# Patient Record
Sex: Male | Born: 1960 | Race: Black or African American | Hispanic: No | Marital: Single | Smoking: Never smoker
Health system: Southern US, Community
[De-identification: ages and names within clinical notes are randomized; demographics above are authoritative.]

## PROBLEM LIST (undated history)

## (undated) DIAGNOSIS — I10 Essential (primary) hypertension: Secondary | ICD-10-CM

## (undated) DIAGNOSIS — Z972 Presence of dental prosthetic device (complete) (partial): Secondary | ICD-10-CM

## (undated) DIAGNOSIS — N63 Unspecified lump in unspecified breast: Secondary | ICD-10-CM

## (undated) DIAGNOSIS — E785 Hyperlipidemia, unspecified: Secondary | ICD-10-CM

## (undated) DIAGNOSIS — T7840XA Allergy, unspecified, initial encounter: Secondary | ICD-10-CM

## (undated) HISTORY — PX: WISDOM TOOTH EXTRACTION: SHX21

## (undated) HISTORY — DX: Hyperlipidemia, unspecified: E78.5

## (undated) HISTORY — DX: Allergy, unspecified, initial encounter: T78.40XA

## (undated) HISTORY — DX: Unspecified lump in unspecified breast: N63.0

## (undated) HISTORY — DX: Essential (primary) hypertension: I10

## (undated) HISTORY — DX: Presence of dental prosthetic device (complete) (partial): Z97.2

---

## 1998-09-05 ENCOUNTER — Encounter: Payer: Self-pay | Admitting: Internal Medicine

## 1998-09-05 ENCOUNTER — Ambulatory Visit (HOSPITAL_COMMUNITY): Admission: RE | Admit: 1998-09-05 | Discharge: 1998-09-05 | Payer: Self-pay | Admitting: Internal Medicine

## 2002-04-06 ENCOUNTER — Encounter: Payer: Self-pay | Admitting: General Practice

## 2002-04-06 ENCOUNTER — Encounter: Admission: RE | Admit: 2002-04-06 | Discharge: 2002-04-06 | Payer: Self-pay | Admitting: General Practice

## 2006-05-16 ENCOUNTER — Ambulatory Visit: Payer: Self-pay | Admitting: Family Medicine

## 2007-10-13 ENCOUNTER — Ambulatory Visit: Payer: Self-pay | Admitting: Family Medicine

## 2007-11-13 ENCOUNTER — Ambulatory Visit: Payer: Self-pay | Admitting: Family Medicine

## 2007-12-15 ENCOUNTER — Ambulatory Visit: Payer: Self-pay | Admitting: Family Medicine

## 2007-12-29 ENCOUNTER — Ambulatory Visit: Payer: Self-pay | Admitting: Family Medicine

## 2008-02-19 ENCOUNTER — Ambulatory Visit: Payer: Self-pay | Admitting: Family Medicine

## 2008-03-04 ENCOUNTER — Ambulatory Visit: Payer: Self-pay | Admitting: Family Medicine

## 2009-01-15 ENCOUNTER — Ambulatory Visit: Payer: Self-pay | Admitting: Family Medicine

## 2009-11-03 ENCOUNTER — Ambulatory Visit: Payer: Self-pay | Admitting: Family Medicine

## 2009-11-06 ENCOUNTER — Ambulatory Visit: Payer: Self-pay | Admitting: Family Medicine

## 2010-02-12 ENCOUNTER — Ambulatory Visit
Admission: RE | Admit: 2010-02-12 | Discharge: 2010-02-12 | Payer: Self-pay | Source: Home / Self Care | Attending: Family Medicine | Admitting: Family Medicine

## 2010-04-13 ENCOUNTER — Ambulatory Visit (INDEPENDENT_AMBULATORY_CARE_PROVIDER_SITE_OTHER): Payer: Managed Care, Other (non HMO) | Admitting: Family Medicine

## 2010-04-13 DIAGNOSIS — Z79899 Other long term (current) drug therapy: Secondary | ICD-10-CM

## 2010-04-13 DIAGNOSIS — E785 Hyperlipidemia, unspecified: Secondary | ICD-10-CM

## 2010-07-26 DIAGNOSIS — N63 Unspecified lump in unspecified breast: Secondary | ICD-10-CM

## 2010-07-26 HISTORY — DX: Unspecified lump in unspecified breast: N63.0

## 2010-08-06 ENCOUNTER — Ambulatory Visit (INDEPENDENT_AMBULATORY_CARE_PROVIDER_SITE_OTHER): Payer: Managed Care, Other (non HMO) | Admitting: Medical

## 2010-08-06 ENCOUNTER — Other Ambulatory Visit: Payer: Self-pay | Admitting: Medical

## 2010-08-06 ENCOUNTER — Encounter: Payer: Self-pay | Admitting: Medical

## 2010-08-06 VITALS — BP 130/98 | HR 60 | Temp 98.0°F | Ht 74.0 in | Wt 227.0 lb

## 2010-08-06 DIAGNOSIS — D492 Neoplasm of unspecified behavior of bone, soft tissue, and skin: Secondary | ICD-10-CM

## 2010-08-06 DIAGNOSIS — N6320 Unspecified lump in the left breast, unspecified quadrant: Secondary | ICD-10-CM

## 2010-08-06 NOTE — Progress Notes (Signed)
Subjective:   Here today for c/o growth on chest x 1-2 mo.  he denies prior similar growth. The area has not changed, but it's asymmetrical. He denies trauma or injury. Denies history of lipoma or cyst. He is a nonsmoker. Otherwise he has been in his usual state of health.  No other aggravating or relieving factors.  No other c/o.  The following portions of the patient's history were reviewed and updated as appropriate: allergies, current medications, past family history, past medical history, past social history, past surgical history and problem list.  Past Medical History  Diagnosis Date  . Hypertension   . Dyslipidemia      Review of Systems Gen.: No fever, chills, sweats, weight loss Skin: No warmth, redness, bruising Chest: Non-tender Heart: No chest pain, palpitations Lungs: No shortness of breath, cough     Objective:   Physical Exam  General appearance: alert, no distress, WD/WN, black male Skin: Unremarkable, no erythema, no dimpling Chest: Left inferomedial chest wall with roughly a 1-1/2 cm diameter broad soft tissue swelling, that is not well-defined, although it is somewhat distinguishable from surrounding tissue. It does not feel bony, but instead feels somewhat spongy in texture. Not particularly cystic in nature. Otherwise chest is nontender without any other mass. Breast: Nontender, no masses, no lymphadenopathy, no nipple changes.    Assessment :    Encounter Diagnosis  Name Primary?  . Neoplasm of soft tissue Yes     Plan:     The lesion on his left inferomedial chest doesn't necessarily feel cystic, but it is somewhat pliable and mobile. We will get breast imaging to determine the texture and size of this lesion. I advised that pending results, he may need excision or biopsy, but we will wait and see.

## 2010-08-10 ENCOUNTER — Ambulatory Visit
Admission: RE | Admit: 2010-08-10 | Discharge: 2010-08-10 | Disposition: A | Discharge status: Home / Self Care | Payer: Managed Care, Other (non HMO) | Source: Ambulatory Visit | Attending: Medical | Admitting: Medical

## 2010-08-10 ENCOUNTER — Ambulatory Visit: Admission: RE | Admit: 2010-08-10 | Payer: Managed Care, Other (non HMO) | Source: Ambulatory Visit

## 2010-08-10 ENCOUNTER — Other Ambulatory Visit: Payer: Self-pay | Admitting: Medical

## 2010-08-10 DIAGNOSIS — D492 Neoplasm of unspecified behavior of bone, soft tissue, and skin: Secondary | ICD-10-CM

## 2010-08-10 DIAGNOSIS — N6320 Unspecified lump in the left breast, unspecified quadrant: Secondary | ICD-10-CM

## 2010-08-11 ENCOUNTER — Telehealth: Payer: Self-pay | Admitting: *Deleted

## 2010-08-11 NOTE — Telephone Encounter (Addendum)
Message copied by Dorthula Perfect on Tue Aug 11, 2010 11:45 AM ------      Message from: Aleen Campi, DAVID S      Created: Mon Aug 10, 2010  4:20 PM       Ultrasound and mammogram suggest fatty lipoma or fat destruction.  No other worrisome finding currently.  They recommend repeat ultrasound of the area in 58mo.  Lets see him back at that time, but if any significant changes in the meantime let us know.   Pt notified of ultrasound and mammogram.  Pt will call back to schedule an appointment for a 3 month follow up.  CM,LPN

## 2010-09-16 ENCOUNTER — Encounter: Payer: Self-pay | Admitting: Family Medicine

## 2010-09-17 ENCOUNTER — Encounter: Payer: Self-pay | Admitting: Family Medicine

## 2011-10-04 ENCOUNTER — Ambulatory Visit (INDEPENDENT_AMBULATORY_CARE_PROVIDER_SITE_OTHER): Payer: Managed Care, Other (non HMO) | Admitting: Medical

## 2011-10-04 ENCOUNTER — Encounter: Payer: Self-pay | Admitting: Medical

## 2011-10-04 VITALS — BP 128/90 | HR 80 | Temp 98.2°F | Resp 16 | Wt 227.0 lb

## 2011-10-04 DIAGNOSIS — I10 Essential (primary) hypertension: Secondary | ICD-10-CM

## 2011-10-04 DIAGNOSIS — E785 Hyperlipidemia, unspecified: Secondary | ICD-10-CM | POA: Insufficient documentation

## 2011-10-04 DIAGNOSIS — D1779 Benign lipomatous neoplasm of other sites: Secondary | ICD-10-CM

## 2011-10-04 DIAGNOSIS — D171 Benign lipomatous neoplasm of skin and subcutaneous tissue of trunk: Secondary | ICD-10-CM

## 2011-10-04 MED ORDER — LISINOPRIL-HYDROCHLOROTHIAZIDE 20-25 MG PO TABS: 1.0000 | ORAL_TABLET | Freq: Every day | ORAL | Status: DC

## 2011-10-04 MED ORDER — VERAPAMIL HCL 180 MG (CO) PO TB24: 180.0000 mg | ORAL_TABLET | Freq: Every day | ORAL | Status: DC

## 2011-10-04 NOTE — Progress Notes (Signed)
Subjective:   HPI  Michael Calhoun is a 51 y.o. male who presents for med check.  Last saw him over a year ago for chest wall lesion which is currently unchanged from a year ago.   We had sent him for mammogram at that time.   He notes it still begin there but doesn't bother him.  Here for recheck on HTN and hyperlipidemia.   Compliant with meds, exercises regularly, reports eating healthy.   No other particularly c/o.   Needs medication refills.    No other c/o.  The following portions of the patient's history were reviewed and updated as appropriate: allergies, current medications, past family history, past medical history, past social history, past surgical history and problem list.  Past Medical History  Diagnosis Date  . Hypertension   . Dyslipidemia   . Allergy     RHINITIS    No Known Allergies   Review of Systems ROS reviewed and was negative other than noted in HPI or above.    Objective:   Physical Exam  General appearance: alert, no distress, WD/WN, AA male Neck: supple, no lymphadenopathy, no thyromegaly, no masses, no bruits Heart: RRR, normal S1, S2, no murmurs Lungs: CTA bilaterally, no wheezes, rhonchi, or rales Pulses: 2+ symmetric Chest wall with 2 cm round soft tissue mass, appear to be subcutaneous but not bony part of chest wall.   Nontender.  No warmth, erythema   Assessment and Plan :     Encounter Diagnoses  Name Primary?  . Essential hypertension, benign Yes  . Hyperlipidemia   . Submuscular lipoma of chest    HTN - increased to Lisinopril HCT 20/25 mg once daily.  C/t Verapamil 180mg  daily.  C/t daily exercise, low salt diet.  Labs today.    Hyperlipidemia - c/t Lipitor 10mg  daily, labs today.  Lipoma of chest per mammogram last year, unchanged.   He will use watch and wait approach.  Does not want general surgery referral at this time.  RTC soon for physical.   We will call with lab results.

## 2011-10-05 ENCOUNTER — Other Ambulatory Visit: Payer: Self-pay | Admitting: Medical

## 2011-10-05 LAB — CBC WITH DIFFERENTIAL/PLATELET
Basophils Absolute: 0 10*3/uL (ref 0.0–0.1)
Basophils Relative: 0 % (ref 0–1)
Eosinophils Absolute: 0.1 10*3/uL (ref 0.0–0.7)
MCH: 30.3 pg (ref 26.0–34.0)
MCHC: 33.8 g/dL (ref 30.0–36.0)
Neutrophils Relative %: 61 % (ref 43–77)
Platelets: 279 10*3/uL (ref 150–400)

## 2011-10-05 LAB — COMPREHENSIVE METABOLIC PANEL
AST: 18 U/L (ref 0–37)
Albumin: 4.3 g/dL (ref 3.5–5.2)
Alkaline Phosphatase: 65 U/L (ref 39–117)
Glucose, Bld: 70 mg/dL (ref 70–99)
Potassium: 3.8 mEq/L (ref 3.5–5.3)
Sodium: 140 mEq/L (ref 135–145)
Total Protein: 7.1 g/dL (ref 6.0–8.3)

## 2011-10-05 LAB — LIPID PANEL: Cholesterol: 201 mg/dL — ABNORMAL HIGH (ref 0–200)

## 2011-10-05 MED ORDER — ATORVASTATIN CALCIUM 20 MG PO TABS: 20.0000 mg | ORAL_TABLET | Freq: Every day | ORAL | Status: AC

## 2011-10-27 ENCOUNTER — Encounter: Payer: Self-pay | Admitting: Internal Medicine

## 2011-11-03 ENCOUNTER — Encounter: Payer: Self-pay | Admitting: Medical

## 2011-11-03 ENCOUNTER — Ambulatory Visit (INDEPENDENT_AMBULATORY_CARE_PROVIDER_SITE_OTHER): Payer: Managed Care, Other (non HMO) | Admitting: Medical

## 2011-11-03 VITALS — BP 112/80 | HR 84 | Temp 98.5°F | Resp 16 | Ht 72.0 in | Wt 226.0 lb

## 2011-11-03 DIAGNOSIS — E785 Hyperlipidemia, unspecified: Secondary | ICD-10-CM

## 2011-11-03 DIAGNOSIS — Z125 Encounter for screening for malignant neoplasm of prostate: Secondary | ICD-10-CM

## 2011-11-03 DIAGNOSIS — Z0000 Encounter for general adult medical examination without abnormal findings: Secondary | ICD-10-CM

## 2011-11-03 DIAGNOSIS — H6120 Impacted cerumen, unspecified ear: Secondary | ICD-10-CM

## 2011-11-03 DIAGNOSIS — I10 Essential (primary) hypertension: Secondary | ICD-10-CM

## 2011-11-03 DIAGNOSIS — H6122 Impacted cerumen, left ear: Secondary | ICD-10-CM

## 2011-11-03 NOTE — Progress Notes (Signed)
Subjective:   HPI  Michael Calhoun is a 51 y.o. male who presents for a complete physical.  I saw him recently for recheck on lipids and hypertension and increased doses on both medications.  He feels fine, in normal state of health.    Preventative care: Last ophthalmology visit: >1 year ago Last dental visit: no regular f/u Last tetanus booster: 2007 Flu vaccine: received through work recently  Reviewed their medical, surgical, family, social, medication, and allergy history and updated chart as appropriate.  Past Medical History  Diagnosis Date  . Hypertension   . Dyslipidemia   . Allergy     RHINITIS  . Wears partial dentures     upper  . Breast mass in male 07/2010    necrosis or possibly an ill-defined lipoma    Past Surgical History  Procedure Date  . Wisdom tooth extraction     Family History  Problem Relation Age of Onset  . Other Father     died of "natural causes"  . Heart disease Neg Hx   . Stroke Neg Hx   . Diabetes Neg Hx   . Cancer Neg Hx     History   Social History  . Marital Status: Single    Spouse Name: N/A    Number of Children: N/A  . Years of Education: N/A   Occupational History  . Medical laboratory scientific officer    Social History Main Topics  . Smoking status: Never Smoker   . Smokeless tobacco: Never Used  . Alcohol Use: 1.2 oz/week    2 Cans of beer per week     6 drinks a month  . Drug Use: No  . Sexually Active: Not on file   Other Topics Concern  . Not on file   Social History Narrative   Walking for exercise, 2 days per week; Married, 2 boys.      Current Outpatient Prescriptions on File Prior to Visit  Medication Sig Dispense Refill  . aspirin 81 MG tablet Take 81 mg by mouth daily.      Marland Kitchen atorvastatin (LIPITOR) 20 MG tablet Take 1 tablet (20 mg total) by mouth daily.  90 tablet  2  . lisinopril-hydrochlorothiazide (PRINZIDE,ZESTORETIC) 20-25 MG per tablet Take 1 tablet by mouth daily.  90 tablet  3  . Multiple Vitamin  (MULTIVITAMIN) tablet Take 1 tablet by mouth daily.        . verapamil (COVERA HS) 180 MG (CO) 24 hr tablet Take 1 tablet (180 mg total) by mouth at bedtime.  90 tablet  3    No Known Allergies   Review of Systems Constitutional: denies fever, chills, sweats, unexpected weight change, anorexia, fatigue Allergy: negative; denies recent sneezing, itching, congestion Dermatology: denies changing moles, rash, lumps, new worrisome lesions ENT: no runny nose, ear pain, sore throat, hoarseness, sinus pain, teeth pain, tinnitus, hearing loss, epistaxis Cardiology: denies chest pain, palpitations, edema, orthopnea, paroxysmal nocturnal dyspnea Respiratory: denies cough, shortness of breath, dyspnea on exertion, wheezing, hemoptysis Gastroenterology: denies abdominal pain, nausea, vomiting, diarrhea, constipation, blood in stool, changes in bowel movement, dysphagia Hematology: denies bleeding or bruising problems Musculoskeletal: denies arthralgias, myalgias, joint swelling, back pain, neck pain, cramping, gait changes Ophthalmology: denies vision changes, eye redness, itching, discharge Urology: denies dysuria, difficulty urinating, hematuria, urinary frequency, urgency, incontinence Neurology: no headache, weakness, tingling, numbness, speech abnormality, memory loss, falls, dizziness Psychology: denies depressed mood, agitation, sleep problems     Objective:   Physical Exam  Filed Vitals:  11/03/11 0937  BP: 132/90  Pulse: 84  Temp: 98.5 F (36.9 C)  Resp: 16    General appearance: alert, no distress, WD/WN, AA male Skin: anterior bilat lower legs with few scattered brown scars from prior minor trauma, few scattered macules on face, right side of nose, and torso, no worrisome lesions HEENT: normocephalic, conjunctiva/corneas normal, sclerae anicteric, PERRLA, EOMi, left ear canal with impacted cerumen, right TM pearly, nares patent, no discharge or erythema, pharynx normal Oral  cavity: MMM, tongue normal, teeth - upper dentures, lower in good repair Neck: supple, no lymphadenopathy, no thyromegaly, no masses, normal ROM, no bruits Chest: non tender, normal shape and expansion Heart: RRR, normal S1, S2, no murmurs Lungs: CTA bilaterally, no wheezes, rhonchi, or rales Abdomen: +bs, soft, non tender, non distended, no masses, no hepatomegaly, no splenomegaly, no bruits Back: non tender, normal ROM, no scoliosis Musculoskeletal: upper extremities non tender, no obvious deformity, normal ROM throughout, lower extremities non tender, no obvious deformity, normal ROM throughout Extremities: no edema, no cyanosis, no clubbing Pulses: 2+ symmetric, upper and lower extremities, normal cap refill Neurological: alert, oriented x 3, CN2-12 intact, strength normal upper extremities and lower extremities, sensation normal throughout, DTRs 2+ throughout, no cerebellar signs, gait normal Psychiatric: normal affect, behavior normal, pleasant  GU: normal male external genitalia, nontender, no masses, no hernia, no lymphadenopathy Rectal: anus normal, prostate WNL, no nodules   Adult ECG Report  Indication: hypertension, exam  Rate: 68bpm  Rhythm: normal sinus rhythm  QRS Axis: 30 degrees  PR Interval:  QRS Duration: 90ms  QTc:  Conduction Disturbances: none  Other Abnormalities: T wave inversions V3, V4  Patient's cardiac risk factors are: dyslipidemia, hypertension and male gender.  EKG comparison: 11/12/2002   Narrative Interpretation: T wave inversions in V3, V4, compared to prior EKG, no other acute changes   Assessment and Plan :    Encounter Diagnoses  Name Primary?  . Routine general medical examination at a health care facility Yes  . Essential hypertension, benign   . Prostate cancer screening   . Hyperlipidemia   . Impacted cerumen of left ear    Physical exam - discussed healthy lifestyle, diet, exercise, preventative care, vaccinations, and  addressed their concerns.  Advised he see ophthalmology and dentist yearly.  HTN - c/t current medications, improved.  No prior cardiology eval.   PSA screening today  Hyperlipidemia - c/t current medication, recheck 35mo  Impacted cerumen - with his consent, used warm water lavage to successfully remove cerumen from left ear canal  Follow-up pending labs

## 2011-11-03 NOTE — Patient Instructions (Signed)
Hypertension/high blood pressure  Avoid added salt in the diet, increase your exercise - more days per week, 40-60 minutes at a time  See eye doctor once a year for screening given your age and history of hypertension  See dentist every year for cleaning.    We are checking some additional labs today.  I'll need to have you recheck on cholesterol in 3 months since we increased the Lipitor.   Preventative Care for Adults, Male       REGULAR HEALTH EXAMS:  A routine yearly physical is a good way to check in with your primary care provider about your health and preventive screening. It is also an opportunity to share updates about your health and any concerns you have, and receive a thorough all-over exam.   Most health insurance companies pay for at least some preventative services.  Check with your health plan for specific coverages.  WHAT PREVENTATIVE SERVICES DO MEN NEED?  Adult men should have their weight and blood pressure checked regularly.   Men age 13 and older should have their cholesterol levels checked regularly.  Beginning at age 41 and continuing to age 20, men should be screened for colorectal cancer.  Certain people should may need continued testing until age 53.  Other cancer screening may include exams for testicular and prostate cancer.  Updating vaccinations is part of preventative care.  Vaccinations help protect against diseases such as the flu.  Lab tests are generally done as part of preventative care to screen for anemia and blood disorders, to screen for problems with the kidneys and liver, to screen for bladder problems, to check blood sugar, and to check your cholesterol level.  Preventative services generally include counseling about diet, exercise, avoiding tobacco, drugs, excessive alcohol consumption, and sexually transmitted infections.    GENERAL RECOMMENDATIONS FOR GOOD HEALTH:  Healthy diet:  Eat a variety of foods, including fruit, vegetables,  animal or vegetable protein, such as meat, fish, chicken, and eggs, or beans, lentils, tofu, and grains, such as rice.  Drink plenty of water daily.  Decrease saturated fat in the diet, avoid lots of red meat, processed foods, sweets, fast foods, and fried foods.  Exercise:  Aerobic exercise helps maintain good heart health. At least 30-40 minutes of moderate-intensity exercise is recommended. For example, a brisk walk that increases your heart rate and breathing. This should be done on most days of the week.   Find a type of exercise or a variety of exercises that you enjoy so that it becomes a part of your daily life.  Examples are running, walking, swimming, water aerobics, and biking.  For motivation and support, explore group exercise such as aerobic class, spin class, Zumba, Yoga,or  martial arts, etc.    Set exercise goals for yourself, such as a certain weight goal, walk or run in a race such as a 5k walk/run.  Speak to your primary care provider about exercise goals.  Disease prevention:  If you smoke or chew tobacco, find out from your caregiver how to quit. It can literally save your life, no matter how long you have been a tobacco user. If you do not use tobacco, never begin.   Maintain a healthy diet and normal weight. Increased weight leads to problems with blood pressure and diabetes.   The Body Mass Index or BMI is a way of measuring how much of your body is fat. Having a BMI above 27 increases the risk of heart disease, diabetes, hypertension,  stroke and other problems related to obesity. Your caregiver can help determine your BMI and based on it develop an exercise and dietary program to help you achieve or maintain this important measurement at a healthful level.  High blood pressure causes heart and blood vessel problems.  Persistent high blood pressure should be treated with medicine if weight loss and exercise do not work.   Fat and cholesterol leaves deposits in your  arteries that can block them. This causes heart disease and vessel disease elsewhere in your body.  If your cholesterol is found to be high, or if you have heart disease or certain other medical conditions, then you may need to have your cholesterol monitored frequently and be treated with medication.   Ask if you should have a stress test if your history suggests this. A stress test is a test done on a treadmill that looks for heart disease. This test can find disease prior to there being a problem.  Avoid drinking alcohol in excess (more than two drinks per day).  Avoid use of street drugs. Do not share needles with anyone. Ask for professional help if you need assistance or instructions on stopping the use of alcohol, cigarettes, and/or drugs.  Brush your teeth twice a day with fluoride toothpaste, and floss once a day. Good oral hygiene prevents tooth decay and gum disease. The problems can be painful, unattractive, and can cause other health problems. Visit your dentist for a routine oral and dental check up and preventive care every 6-12 months.   Look at your skin regularly.  Use a mirror to look at your back. Notify your caregivers of changes in moles, especially if there are changes in shapes, colors, a size larger than a pencil eraser, an irregular border, or development of new moles.  Safety:  Use seatbelts 100% of the time, whether driving or as a passenger.  Use safety devices such as hearing protection if you work in environments with loud noise or significant background noise.  Use safety glasses when doing any work that could send debris in to the eyes.  Use a helmet if you ride a bike or motorcycle.  Use appropriate safety gear for contact sports.  Talk to your caregiver about gun safety.  Use sunscreen with a SPF (or skin protection factor) of 15 or greater.  Lighter skinned people are at a greater risk of skin cancer. Don't forget to also wear sunglasses in order to protect your eyes  from too much damaging sunlight. Damaging sunlight can accelerate cataract formation.   Practice safe sex. Use condoms. Condoms are used for birth control and to help reduce the spread of sexually transmitted infections (or STIs).  Some of the STIs are gonorrhea (the clap), chlamydia, syphilis, trichomonas, herpes, HPV (human papilloma virus) and HIV (human immunodeficiency virus) which causes AIDS. The herpes, HIV and HPV are viral illnesses that have no cure. These can result in disability, cancer and death.   Keep carbon monoxide and smoke detectors in your home functioning at all times. Change the batteries every 6 months or use a model that plugs into the wall.   Vaccinations:  Stay up to date with your tetanus shots and other required immunizations. You should have a booster for tetanus every 10 years. Be sure to get your flu shot every year, since 5%-20% of the [place] population comes down with the flu. The flu vaccine changes each year, so being vaccinated once is not enough. Get your shot in  the fall, before the flu season peaks.   Other vaccines to consider:  Pneumococcal vaccine to protect against certain types of pneumonia.  This is normally recommended for adults age 69 or older.  However, adults younger than 49 years old with certain underlying conditions such as diabetes, heart or lung disease should also receive the vaccine.  Shingles vaccine to protect against Varicella Zoster if you are older than age 28, or younger than 49 years old with certain underlying illness.  Hepatitis A vaccine to protect against a form of infection of the liver by a virus acquired from food.  Hepatitis B vaccine to protect against a form of infection of the liver by a virus acquired from blood or body fluids, particularly if you work in health care.  If you plan to travel internationally, check with your local health department for specific vaccination recommendations.  Cancer Screening:  Most routine  colon cancer screening begins at the age of 91. On a yearly basis, doctors may provide special easy to use take-home tests to check for hidden blood in the stool. Sigmoidoscopy or colonoscopy can detect the earliest forms of colon cancer and is life saving. These tests use a small camera at the end of a tube to directly examine the colon. Speak to your caregiver about this at age 47, when routine screening begins (and is repeated every 5 years unless early forms of pre-cancerous polyps or small growths are found).   At the age of 41 men usually start screening for prostate cancer every year. Screening may begin at a younger age for those with higher risk. Those at higher risk include African-Americans or having a family history of prostate cancer. There are two types of tests for prostate cancer:   Prostate-specific antigen (PSA) testing. Recent studies raise questions about prostate cancer using PSA and you should discuss this with your caregiver.   Digital rectal exam (in which your doctor's lubricated and gloved finger feels for enlargement of the prostate through the anus).   Screening for testicular cancer.  Do a monthly exam of your testicles. Gently roll each testicle between your thumb and fingers, feeling for any abnormal lumps. The best time to do this is after a hot shower or bath when the tissues are looser. Notify your caregivers of any lumps, tenderness or changes in size or shape immediately.

## 2011-11-04 LAB — HIGH SENSITIVITY CRP: CRP, High Sensitivity: 1.9 mg/L

## 2011-11-04 LAB — PSA: PSA: 0.51 ng/mL (ref <=4.00)

## 2011-11-08 ENCOUNTER — Telehealth: Payer: Self-pay | Admitting: Family Medicine

## 2011-11-08 NOTE — Telephone Encounter (Signed)
PATIENT IS AWARE OF HIS APPOINTMENT WITH DR.TILLEY ON 11/11/11 @ 145 PM. CLS [phone]

## 2011-11-29 ENCOUNTER — Encounter: Payer: Self-pay | Admitting: Medical

## 2014-05-22 ENCOUNTER — Encounter: Payer: Self-pay | Admitting: Family Medicine

## 2014-05-22 ENCOUNTER — Ambulatory Visit (INDEPENDENT_AMBULATORY_CARE_PROVIDER_SITE_OTHER): Payer: No Typology Code available for payment source | Admitting: Family Medicine

## 2014-05-22 VITALS — BP 150/114 | HR 97 | Ht 73.25 in | Wt 242.6 lb

## 2014-05-22 DIAGNOSIS — Z1211 Encounter for screening for malignant neoplasm of colon: Secondary | ICD-10-CM | POA: Diagnosis not present

## 2014-05-22 DIAGNOSIS — Z0000 Encounter for general adult medical examination without abnormal findings: Secondary | ICD-10-CM | POA: Diagnosis not present

## 2014-05-22 DIAGNOSIS — J301 Allergic rhinitis due to pollen: Secondary | ICD-10-CM | POA: Diagnosis not present

## 2014-05-22 DIAGNOSIS — I10 Essential (primary) hypertension: Secondary | ICD-10-CM

## 2014-05-22 DIAGNOSIS — E785 Hyperlipidemia, unspecified: Secondary | ICD-10-CM

## 2014-05-22 LAB — LIPID PANEL
CHOL/HDL RATIO: 4.8 ratio
Cholesterol: 222 mg/dL — ABNORMAL HIGH (ref 0–200)
HDL: 46 mg/dL (ref >=40)
LDL Cholesterol: 157 mg/dL — ABNORMAL HIGH (ref 0–99)
Triglycerides: 95 mg/dL (ref <150)
VLDL: 19 mg/dL (ref 0–40)

## 2014-05-22 LAB — COMPREHENSIVE METABOLIC PANEL
ALT: 24 U/L (ref 0–53)
AST: 24 U/L (ref 0–37)
Albumin: 4.2 g/dL (ref 3.5–5.2)
Alkaline Phosphatase: 54 U/L (ref 39–117)
BILIRUBIN TOTAL: 0.7 mg/dL (ref 0.2–1.2)
BUN: 12 mg/dL (ref 6–23)
CHLORIDE: 103 meq/L (ref 96–112)
CO2: 30 meq/L (ref 19–32)
CREATININE: 1.12 mg/dL (ref 0.50–1.35)
Calcium: 9.3 mg/dL (ref 8.4–10.5)
Glucose, Bld: 93 mg/dL (ref 70–99)
Potassium: 3.4 mEq/L — ABNORMAL LOW (ref 3.5–5.3)
SODIUM: 142 meq/L (ref 135–145)
TOTAL PROTEIN: 6.8 g/dL (ref 6.0–8.3)

## 2014-05-22 LAB — CBC WITH DIFFERENTIAL/PLATELET
BASOS ABS: 0 10*3/uL (ref 0.0–0.1)
BASOS PCT: 0 % (ref 0–1)
EOS ABS: 0.1 10*3/uL (ref 0.0–0.7)
Eosinophils Relative: 2 % (ref 0–5)
HCT: 39.3 % (ref 39.0–52.0)
Hemoglobin: 13.3 g/dL (ref 13.0–17.0)
Lymphocytes Relative: 31 % (ref 12–46)
Lymphs Abs: 2 10*3/uL (ref 0.7–4.0)
MCH: 30.3 pg (ref 26.0–34.0)
MCHC: 33.8 g/dL (ref 30.0–36.0)
MCV: 89.5 fL (ref 78.0–100.0)
MONOS PCT: 7 % (ref 3–12)
MPV: 10.4 fL (ref 8.6–12.4)
Monocytes Absolute: 0.4 10*3/uL (ref 0.1–1.0)
NEUTROS PCT: 60 % (ref 43–77)
Neutro Abs: 3.8 10*3/uL (ref 1.7–7.7)
PLATELETS: 262 10*3/uL (ref 150–400)
RBC: 4.39 MIL/uL (ref 4.22–5.81)
RDW: 13.5 % (ref 11.5–15.5)
WBC: 6.4 10*3/uL (ref 4.0–10.5)

## 2014-05-22 MED ORDER — LISINOPRIL-HYDROCHLOROTHIAZIDE 20-25 MG PO TABS: 1.0000 | ORAL_TABLET | Freq: Every day | ORAL | Status: DC

## 2014-05-22 MED ORDER — VERAPAMIL HCL ER 360 MG PO CP24: 360.0000 mg | ORAL_CAPSULE | Freq: Every day | ORAL | Status: DC

## 2014-05-22 NOTE — Progress Notes (Signed)
   Subjective:    Patient ID: Michael Calhoun, male    DOB: [birth year], 54 y.o.   MRN: [id number]  HPI He is here for complete examination. He has been out of the country for several years. 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 His symptoms are mainly sneezing, itchy watery eyes, nasal congestion. He continues on his blood pressure medications and states that he has been taking these regularly.He does not complain of chest pain, shortness of breath, GI or GU symptomsHis marriage is going reasonably well. He has 2 children. Family and social history as well as immunizations and health maintenance was read.   Review of Systems  All other systems reviewed and are negative.      Objective:   Physical Exam BP 150/114 mmHg  Pulse 97  Ht 6' 1.25" (1.861 m)  Wt 242 lb 9.6 oz (110.043 kg)  BMI 31.77 kg/m2  General Appearance:    Alert, cooperative, no distress, appears stated age  Head:    Normocephalic, without obvious abnormality, atraumatic  Eyes:    PERRL, conjunctiva/corneas clear, EOM's intact, fundi    benign  Ears:    Normal TM's and external ear canals  Nose:   Nares normal, mucosa normal, no drainage or sinus   tenderness  Throat:   Lips, mucosa, and tongue normal; teeth and gums normal  Neck:   Supple, no lymphadenopathy;  thyroid:  no   enlargement/tenderness/nodules; no carotid   bruit or JVD  Back:    Spine nontender, no curvature, ROM normal, no CVA     tenderness  Lungs:     Clear to auscultation bilaterally without wheezes, rales or     ronchi; respirations unlabored  Chest Wall:    No tenderness or deformity   Heart:    Regular rate and rhythm, S1 and S2 normal, no murmur, rub   or gallop  Breast Exam:    No chest wall tenderness, masses or gynecomastia  Abdomen:     Soft, non-tender, nondistended, normoactive bowel sounds,    no masses, no hepatosplenomegaly          Extremities:   No clubbing, cyanosis or edema  Pulses:   2+ and symmetric all extremities  Skin:   Skin color, texture, turgor normal, no rashes or lesions  Lymph nodes:   Cervical, supraclavicular, and axillary nodes normal  Neurologic:   CNII-XII intact, normal strength, sensation and gait; reflexes 2+ and symmetric throughout          Psych:   Normal mood, affect, hygiene and grooming.          Assessment & Plan:  Routine general medical examination at a health care facility - Plan: CBC with Differential/Platelet, Comprehensive metabolic panel, Lipid panel  Special screening for malignant neoplasms, colon - Plan: Ambulatory referral to Gastroenterology  Essential hypertension, benign - Plan: verapamil (VERELAN PM) 360 MG 24 hr capsule, lisinopril-hydrochlorothiazide (PRINZIDE,ZESTORETIC) 20-25 MG per tablet, CBC with Differential/Platelet, Comprehensive metabolic panel  Hyperlipidemia - Plan: Lipid panel  Allergic rhinitis due to pollen I will increase his verapamil to 3 and 60 mg and continue on his other medication. He will continue on Claritin for his allergies. Follow-up here in one month.

## 2014-06-14 ENCOUNTER — Encounter: Payer: Self-pay | Admitting: Internal Medicine

## 2014-06-25 ENCOUNTER — Ambulatory Visit (INDEPENDENT_AMBULATORY_CARE_PROVIDER_SITE_OTHER): Payer: No Typology Code available for payment source | Admitting: Family Medicine

## 2014-06-25 ENCOUNTER — Encounter: Payer: Self-pay | Admitting: Family Medicine

## 2014-06-25 VITALS — BP 126/82 | HR 78 | Ht 73.0 in | Wt 236.4 lb

## 2014-06-25 DIAGNOSIS — I10 Essential (primary) hypertension: Secondary | ICD-10-CM

## 2014-06-25 MED ORDER — VERAPAMIL HCL ER 360 MG PO CP24: 360.0000 mg | ORAL_CAPSULE | Freq: Every day | ORAL | Status: DC

## 2014-06-25 NOTE — Progress Notes (Signed)
   Subjective:    Patient ID: Michael Calhoun, male    DOB: 06-Oct-1960, 54 y.o.   MRN: [id number]  HPI He is here for recheck. He is now on a higher strength of verapamil. He is having no difficulty with that.   Review of Systems     Objective:   Physical Exam Alert and in no distress otherwise not examined. Blood pressure is recorded       Assessment & Plan:  Essential hypertension, benign - Plan: verapamil (VERELAN PM) 360 MG 24 hr capsule He will continue on his present medication regimen.

## 2014-08-07 ENCOUNTER — Ambulatory Visit (AMBULATORY_SURGERY_CENTER): Payer: Self-pay

## 2014-08-07 VITALS — Ht 74.0 in | Wt 237.4 lb

## 2014-08-07 DIAGNOSIS — Z1211 Encounter for screening for malignant neoplasm of colon: Secondary | ICD-10-CM

## 2014-08-07 MED ORDER — MOVIPREP 100 G PO SOLR: 1.0000 | Freq: Once | ORAL | Status: DC

## 2014-08-07 NOTE — Progress Notes (Signed)
No allergies to eggs or soy No diet/weight loss meds No home oxygen No past problems with anesthesia  Has email  Emmi instructions given for colonoscopy 

## 2014-08-21 ENCOUNTER — Encounter: Payer: Self-pay | Admitting: Internal Medicine

## 2014-08-21 ENCOUNTER — Ambulatory Visit (AMBULATORY_SURGERY_CENTER): Payer: No Typology Code available for payment source | Admitting: Internal Medicine

## 2014-08-21 VITALS — BP 138/96 | HR 72 | Temp 97.2°F | Resp 20 | Ht 74.0 in | Wt 237.0 lb

## 2014-08-21 DIAGNOSIS — Z1211 Encounter for screening for malignant neoplasm of colon: Secondary | ICD-10-CM

## 2014-08-21 MED ORDER — SODIUM CHLORIDE 0.9 % IV SOLN: 500.0000 mL | INTRAVENOUS | Status: DC

## 2014-08-21 NOTE — Patient Instructions (Signed)
YOU HAD AN ENDOSCOPIC PROCEDURE TODAY AT [facility]:   Refer to the procedure report that was given to you for any specific questions about what was found during the examination.  If the procedure report does not answer your questions, please call your gastroenterologist to clarify.  If you requested that your care partner not be given the details of your procedure findings, then the procedure report has been included in a sealed envelope for you to review at your convenience later.  YOU SHOULD EXPECT: Some feelings of bloating in the abdomen. Passage of more gas than usual.  Walking can help get rid of the air that was put into your GI tract during the procedure and reduce the bloating. If you had a lower endoscopy (such as a colonoscopy or flexible sigmoidoscopy) you may notice spotting of blood in your stool or on the toilet paper. If you underwent a bowel prep for your procedure, you may not have a normal bowel movement for a few days.  Please Note:  You might notice some irritation and congestion in your nose or some drainage.  This is from the oxygen used during your procedure.  There is no need for concern and it should clear up in a day or so.  SYMPTOMS TO REPORT IMMEDIATELY:   Following lower endoscopy (colonoscopy or flexible sigmoidoscopy):  Excessive amounts of blood in the stool  Significant tenderness or worsening of abdominal pains  Swelling of the abdomen that is new, acute  Fever of 100F or higher  For urgent or emergent issues, a gastroenterologist can be reached at any hour by calling [phone].   DIET: Your first meal following the procedure should be a small meal and then it is ok to progress to your normal diet. Heavy or fried foods are harder to digest and may make you feel nauseous or bloated.  Likewise, meals heavy in dairy and vegetables can increase bloating.  Drink plenty of fluids but you should avoid alcoholic beverages for 24  hours.  ACTIVITY:  You should plan to take it easy for the rest of today and you should NOT DRIVE or use heavy machinery until tomorrow (because of the sedation medicines used during the test).    FOLLOW UP: Our staff will call the number listed on your records the next business day following your procedure to check on you and address any questions or concerns that you may have regarding the information given to you following your procedure. If we do not reach you, we will leave a message.  However, if you are feeling well and you are not experiencing any problems, there is no need to return our call.  We will assume that you have returned to your regular daily activities without incident.  If any biopsies were taken you will be contacted by phone or by letter within the next 1-3 weeks.  Please call us at [phone] if you have not heard about the biopsies in 3 weeks.    SIGNATURES/CONFIDENTIALITY: You and/or your care partner have signed paperwork which will be entered into your electronic medical record.  These signatures attest to the fact that that the information above on your After Visit Summary has been reviewed and is understood.  Full responsibility of the confidentiality of this discharge information lies with you and/or your care-partner.  Diverticulosis, high fiber diet-handouts given  Repeat colonoscopy in 10 years 2026.

## 2014-08-21 NOTE — Op Note (Signed)
[place]  [address] [place], [address]   COLONOSCOPY PROCEDURE REPORT  PATIENT: Michael Calhoun, Michael Calhoun  MR#: [id number] BIRTHDATE: 1960/04/28 , 54  yrs. old GENDER: male ENDOSCOPIST: Eustace Quail, MD REFERRED PJ:[employer], M.D. PROCEDURE DATE:  08/21/2014 PROCEDURE:   Colonoscopy, screening First Screening Colonoscopy - Avg.  risk and is 50 yrs.  old or older Yes.  Prior Negative Screening - Now for repeat screening. N/A  History of Adenoma - Now for follow-up colonoscopy & has been > or = to 3 yrs.  N/A  Polyps removed today? No Recommend repeat exam, <10 yrs? No ASA CLASS:   Class II INDICATIONS:Screening for colonic neoplasia and Colorectal Neoplasm Risk Assessment for this procedure is average risk. MEDICATIONS: Monitored anesthesia care and Propofol 400 mg IV  DESCRIPTION OF PROCEDURE:   After the risks benefits and alternatives of the procedure were thoroughly explained, informed consent was obtained.  The digital rectal exam revealed no abnormalities of the rectum.   The [id number]  endoscope was introduced through the anus and advanced to the cecum, which was identified by both the appendix and ileocecal valve. No adverse events experienced.   The quality of the prep was excellent. (MoviPrep was used)  The instrument was then slowly withdrawn as the colon was fully examined. Estimated blood loss is zero unless otherwise noted in this procedure report.      COLON FINDINGS: There was moderate diverticulosis noted in portions of the right and left colon. No polyps or other mucosal abnormalities.   The examination was otherwise normal.  Retroflexed views revealed internal hemorrhoids. The time to cecum = 5.0 Withdrawal time = 12.1   The scope was withdrawn and the procedure completed. COMPLICATIONS: There were no immediate complications.  ENDOSCOPIC IMPRESSION: 1.   Moderate diverticulosis 2.   The examination was otherwise  normal  RECOMMENDATIONS: 1. Continue current colorectal screening recommendations for "routine risk" patients with a repeat colonoscopy in 10 years.  eSigned:  Eustace Quail, MD 08/21/2014 9:37 AM   cc: Jill Alexanders, MD and The Patient

## 2014-08-21 NOTE — Progress Notes (Signed)
Report to PACU, RN, vss, BBS= Clear.  

## 2014-08-22 ENCOUNTER — Telehealth: Payer: Self-pay | Admitting: *Deleted

## 2014-08-22 NOTE — Telephone Encounter (Signed)
No answer. Number identifier. Message not able to be left as mailbox is full.

## 2014-11-15 ENCOUNTER — Ambulatory Visit (INDEPENDENT_AMBULATORY_CARE_PROVIDER_SITE_OTHER): Payer: No Typology Code available for payment source | Admitting: Medical

## 2014-11-15 ENCOUNTER — Encounter: Payer: Self-pay | Admitting: Medical

## 2014-11-15 VITALS — BP 90/60 | HR 83 | Temp 98.6°F | Wt 235.0 lb

## 2014-11-15 DIAGNOSIS — H9201 Otalgia, right ear: Secondary | ICD-10-CM

## 2014-11-15 MED ORDER — TRAMADOL HCL 50 MG PO TABS: 50.0000 mg | ORAL_TABLET | Freq: Three times a day (TID) | ORAL | Status: DC | PRN

## 2014-11-15 NOTE — Progress Notes (Signed)
Subjective: Chief Complaint  Patient presents with  . ear pain    started a week ago. hasnt had any cold recently.    Here for a pain in right ear x 1 week.  The pain radiates into the right scalp and forehead.   Denies runny nose, congestion, sore throat, no eye pain, no fever.  Has some right face/ possible teeth pain.   Last dentist visit over a year ago.   Using Tylenol for pain.   No change in hearing.  No allergy problems, no sinus problems, no recent head trauma, no throbbing blood vessels, no hx/o chronic headaches, no neck pain, no paresthesias.  Pain is achy, intermittent, not sharp, not throbbing.   No other aggravating or relieving factors. No other complaint.  Past Medical History  Diagnosis Date  . Hypertension   . Dyslipidemia   . Allergy     RHINITIS  . Wears partial dentures     upper  . Breast mass in male 07/2010    necrosis or possibly an ill-defined lipoma   ROS as in subjective  Objective: BP 90/60 mmHg  Pulse 83  Temp(Src) 98.6 F (37 C) (Oral)  Wt 235 lb (106.595 kg)  SpO2 95%  General appearance: alert, no distress, WD/WN HEENT: normocephalic, head and scalp non tender, sclerae anicteric, TMs pearly, nares patent, no discharge or erythema, pharynx normal Oral cavity: MMM, no lesions, upper denture Skin: no rash Neck: supple, no lymphadenopathy, no thyromegaly, no masses, nontender, normal ROM    Assessment: Encounter Diagnosis  Name Primary?  Michael Calhoun of right ear Yes     Plan: Exam noncontributory.   He does have dentures, but says he practices good hygiene with them.  Etiology not clear.  discussed possible etiologies.   will have him use a week of antihistamine, ibuprofen, Ultram for worse pain, and if new symptoms, worse symptoms, or not improving within a week, then recheck.

## 2014-11-15 NOTE — Patient Instructions (Signed)
The cause of your ear pain is not clear.   There is nothing obvious on your exam  Recommendations:   Begin over the counter Ibuprofen 2-3 times daily for the next week.  You may use the Ultram pain medication as needed for worse or moderate pain  Begin Benadryl or Zyrtec at bedtime for the next week in the event this is related to inner ear pressure  Drink plenty of water, swallow throughout the day to equalize ear pressure.  If your symptoms change, worsen or don't improve within a week, then let me know  Cconsider yearly follow up with a dentist soon.   Earache An earache, also called otalgia, can be caused by many things. Pain from an earache can be sharp, dull, or burning. The pain may be temporary or constant. Earaches can be caused by problems with the ear, such as infection in either the middle ear or the ear canal, injury, impacted ear wax, middle ear pressure, or a foreign body in the ear. Ear pain can also result from problems in other areas. This is called referred pain. For example, pain can come from a sore throat, a tooth infection, or problems with the jaw or the joint between the jaw and the skull (temporomandibular joint, or TMJ). The cause of an earache is not always easy to identify. Watchful waiting may be appropriate for some earaches until a clear cause of the pain can be found. HOME CARE INSTRUCTIONS Watch your condition for any changes. The following actions may help to lessen any discomfort that you are feeling:  Take medicines only as directed by your health care provider. This includes ear drops.  Apply ice to your outer ear to help reduce pain.  Put ice in a plastic bag.  Place a towel between your skin and the bag.  Leave the ice on for 20 minutes, 2-3 times per day.  Do not put anything in your ear other than medicine that is prescribed by your health care provider.  Try resting in an upright position instead of lying down. This may help to reduce  pressure in the middle ear and relieve pain.  Chew gum if it helps to relieve your ear pain.  Control any allergies that you have.  Keep all follow-up visits as directed by your health care provider. This is important. SEEK MEDICAL CARE IF:  Your pain does not improve within 2 days.  You have a fever.  You have new or worsening symptoms. SEEK IMMEDIATE MEDICAL CARE IF:  You have a severe headache.  You have a stiff neck.  You have difficulty swallowing.  You have redness or swelling behind your ear.  You have drainage from your ear.  You have hearing loss.  You feel dizzy.   This information is not intended to replace advice given to you by your health care provider. Make sure you discuss any questions you have with your health care provider.   Document Released: 08/29/2003 Document Revised: 02/01/2014 Document Reviewed: 08/12/2013 Elsevier Interactive Patient Education [employer].

## 2015-07-05 ENCOUNTER — Other Ambulatory Visit: Payer: Self-pay | Admitting: Family Medicine

## 2015-07-07 NOTE — Telephone Encounter (Signed)
Pt needs an appt. Left vm for pt to call back

## 2015-07-10 NOTE — Telephone Encounter (Signed)
Left message for pt to call back. Denying med for now.

## 2015-07-11 ENCOUNTER — Telehealth: Payer: Self-pay

## 2015-07-11 NOTE — Telephone Encounter (Signed)
We have called and left message for pt he needs appointment when that is made then we can fill his med

## 2015-07-11 NOTE — Telephone Encounter (Signed)
Faxed request for lisinopril-HCTZ to [facility]. RLB

## 2016-12-03 ENCOUNTER — Emergency Department (HOSPITAL_COMMUNITY): Payer: No Typology Code available for payment source

## 2016-12-03 ENCOUNTER — Other Ambulatory Visit: Payer: Self-pay

## 2016-12-03 ENCOUNTER — Encounter (HOSPITAL_COMMUNITY): Payer: Self-pay | Admitting: Emergency Medicine

## 2016-12-03 ENCOUNTER — Emergency Department (HOSPITAL_COMMUNITY)
Admission: EM | Admit: 2016-12-03 | Discharge: 2016-12-03 | Disposition: A | Discharge status: Home / Self Care | Payer: No Typology Code available for payment source | Attending: Emergency Medicine | Admitting: Emergency Medicine

## 2016-12-03 DIAGNOSIS — Y929 Unspecified place or not applicable: Secondary | ICD-10-CM | POA: Insufficient documentation

## 2016-12-03 DIAGNOSIS — S161XXA Strain of muscle, fascia and tendon at neck level, initial encounter: Secondary | ICD-10-CM | POA: Diagnosis not present

## 2016-12-03 DIAGNOSIS — I10 Essential (primary) hypertension: Secondary | ICD-10-CM | POA: Diagnosis not present

## 2016-12-03 DIAGNOSIS — Y939 Activity, unspecified: Secondary | ICD-10-CM | POA: Insufficient documentation

## 2016-12-03 DIAGNOSIS — R51 Headache: Secondary | ICD-10-CM | POA: Diagnosis present

## 2016-12-03 DIAGNOSIS — Z79899 Other long term (current) drug therapy: Secondary | ICD-10-CM | POA: Diagnosis not present

## 2016-12-03 DIAGNOSIS — S39012A Strain of muscle, fascia and tendon of lower back, initial encounter: Secondary | ICD-10-CM | POA: Diagnosis not present

## 2016-12-03 DIAGNOSIS — Y999 Unspecified external cause status: Secondary | ICD-10-CM | POA: Insufficient documentation

## 2016-12-03 DIAGNOSIS — Y92413 State road as the place of occurrence of the external cause: Secondary | ICD-10-CM | POA: Diagnosis not present
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
## 2016-12-03 MED ORDER — METHOCARBAMOL 500 MG PO TABS: 500.0000 mg | ORAL_TABLET | Freq: Three times a day (TID) | ORAL | 0 refills | Status: DC | PRN

## 2016-12-03 MED ORDER — IBUPROFEN 800 MG PO TABS
800.0000 mg | ORAL_TABLET | Freq: Once | ORAL | Status: AC
  Administered 2016-12-03: 800 mg via ORAL
  Filled 2016-12-03: qty 1

## 2016-12-03 MED ORDER — IBUPROFEN 800 MG PO TABS: 800.0000 mg | ORAL_TABLET | Freq: Three times a day (TID) | ORAL | 0 refills | Status: DC

## 2016-12-03 MED ORDER — METHOCARBAMOL 500 MG PO TABS
1000.0000 mg | ORAL_TABLET | Freq: Once | ORAL | Status: AC
  Administered 2016-12-03: 1000 mg via ORAL
  Filled 2016-12-03: qty 2
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
## 2016-12-03 NOTE — ED Notes (Signed)
Pt. Stated that he is due to take his blood pressure medicine at this time and will take the dose once home. To notify MD.

## 2016-12-03 NOTE — Discharge Instructions (Signed)
Ice to painful areas for the first 48 hours before using heat. Expect slow improvement over one week.
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
## 2016-12-03 NOTE — ED Notes (Signed)
C-Collar removed per Jeneen Rinks, MD

## 2017-07-18 ENCOUNTER — Encounter (HOSPITAL_COMMUNITY): Payer: Self-pay | Admitting: Emergency Medicine

## 2017-07-18 ENCOUNTER — Ambulatory Visit (HOSPITAL_COMMUNITY)
Admission: EM | Admit: 2017-07-18 | Discharge: 2017-07-18 | Disposition: A | Discharge status: Home / Self Care | Payer: No Typology Code available for payment source | Attending: Family Medicine | Admitting: Family Medicine

## 2017-07-18 DIAGNOSIS — R112 Nausea with vomiting, unspecified: Secondary | ICD-10-CM

## 2017-07-18 MED ORDER — ONDANSETRON 8 MG PO TBDP: 8.0000 mg | ORAL_TABLET | Freq: Three times a day (TID) | ORAL | 0 refills | Status: DC | PRN

## 2017-07-18 NOTE — Discharge Instructions (Addendum)
Clear liquids tonight.  Return if symptoms persist or worsen

## 2017-07-18 NOTE — ED Triage Notes (Signed)
PT reports 3 episodes of vomiting since 2pm. No diarrhea. PT reports dizziness started with vomiting. Denies chest pain, neck pain, back pain, SOB.

## 2017-07-18 NOTE — ED Provider Notes (Signed)
[facility]   [id number] 07/18/17 Arrival Time: 1835   SUBJECTIVE:  Michael Calhoun is a 57 y.o. male who presents to the urgent care with complaint of 3 episodes of vomiting since 2pm. No diarrhea. PT reports dizziness started with vomiting. Denies chest pain, neck pain, back pain, SOB.   Patient began feeling sick after cup of coffee at a truck stop yesterday evening.  He has had no abdominal pain during this entire illness.  Patient is a Administrator.  He is originally from [place].   Past Medical History:  Diagnosis Date  . Allergy    RHINITIS  . Breast mass in male 07/2010   necrosis or possibly an ill-defined lipoma  . Dyslipidemia   . Hypertension   . Wears partial dentures    upper   Family History  Problem Relation Age of Onset  . Other Father        died of "natural causes"  . Heart disease Neg Hx   . Stroke Neg Hx   . Diabetes Neg Hx   . Cancer Neg Hx   . Colon cancer Neg Hx   . Colon polyps Neg Hx   . Crohn's disease Neg Hx   . Ulcerative colitis Neg Hx   . Rectal cancer Neg Hx   . Stomach cancer Neg Hx   . Pancreatic cancer Neg Hx   . Liver cancer Neg Hx    Social History   Socioeconomic History  . Marital status: Single    Spouse name: Not on file  . Number of children: Not on file  . Years of education: Not on file  . Highest education level: Not on file  Occupational History  . Occupation: IT consultant: [employer]  . Financial resource strain: Not on file  . Food insecurity:    Worry: Not on file    Inability: Not on file  . Transportation needs:    Medical: Not on file    Non-medical: Not on file  Tobacco Use  . Smoking status: Never Smoker  . Smokeless tobacco: Never Used  Substance and Sexual Activity  . Alcohol use: Yes    Alcohol/week: 1.2 oz    Types: 2 Cans of beer per week    Comment: 6 drinks a month  . Drug use: No  . Sexual activity: Yes  Lifestyle  . Physical  activity:    Days per week: Not on file    Minutes per session: Not on file  . Stress: Not on file  Relationships  . Social connections:    Talks on phone: Not on file    Gets together: Not on file    Attends religious service: Not on file    Active member of club or organization: Not on file    Attends meetings of clubs or organizations: Not on file    Relationship status: Not on file  . Intimate partner violence:    Fear of current or ex partner: Not on file    Emotionally abused: Not on file    Physically abused: Not on file    Forced sexual activity: Not on file  Other Topics Concern  . Not on file  Social History Narrative   Walking for exercise, 2 days per week; Married, 2 boys.     Current Meds  Medication Sig  . aspirin 81 MG tablet Take 81 mg by mouth daily.  Marland Kitchen ibuprofen (ADVIL,MOTRIN) 800 MG tablet  Take 1 tablet (800 mg total) 3 (three) times daily by mouth.  Marland Kitchen lisinopril-hydrochlorothiazide (PRINZIDE,ZESTORETIC) 20-25 MG per tablet Take 1 tablet by mouth daily.  . Multiple Vitamin (MULTIVITAMIN) tablet Take 1 tablet by mouth daily.    . verapamil (VERELAN PM) 360 MG 24 hr capsule Take 1 capsule (360 mg total) by mouth at bedtime.   No Known Allergies    ROS: As per HPI, remainder of ROS negative.   OBJECTIVE:   Vitals:   07/18/17 1900 07/18/17 1901  BP:  (!) 165/111  Pulse:  79  Resp:  16  Temp:  98.7 F (37.1 C)  TempSrc:  Oral  SpO2:  98%  Weight: 225 lb (102.1 kg)      General appearance: alert; no distress Eyes: PERRL; EOMI; conjunctiva normal HENT: normocephalic; atraumatic; nasal mucosa normal; oral mucosa normal Neck: supple Lungs: clear to auscultation bilaterally Heart: regular rate and rhythm Abdomen: soft, non-tender; bowel sounds normal; no masses or organomegaly; no guarding or rebound tenderness Back: no CVA tenderness Extremities: no cyanosis or edema; symmetrical with no gross deformities Skin: warm and dry Neurologic: normal  gait; grossly normal Psychological: alert and cooperative; normal mood and affect      Labs:  Results for orders placed or performed in visit on 05/22/14  CBC with Differential/Platelet  Result Value Ref Range   WBC 6.4 4.0 - 10.5 K/uL   RBC 4.39 4.22 - 5.81 MIL/uL   Hemoglobin 13.3 13.0 - 17.0 g/dL   HCT 39.3 39.0 - 52.0 %   MCV 89.5 78.0 - 100.0 fL   MCH 30.3 26.0 - 34.0 pg   MCHC 33.8 30.0 - 36.0 g/dL   RDW 13.5 11.5 - 15.5 %   Platelets 262 150 - 400 K/uL   MPV 10.4 8.6 - 12.4 fL   Neutrophils Relative % 60 43 - 77 %   Neutro Abs 3.8 1.7 - 7.7 K/uL   Lymphocytes Relative 31 12 - 46 %   Lymphs Abs 2.0 0.7 - 4.0 K/uL   Monocytes Relative 7 3 - 12 %   Monocytes Absolute 0.4 0.1 - 1.0 K/uL   Eosinophils Relative 2 0 - 5 %   Eosinophils Absolute 0.1 0.0 - 0.7 K/uL   Basophils Relative 0 0 - 1 %   Basophils Absolute 0.0 0.0 - 0.1 K/uL   Smear Review Criteria for review not met   Comprehensive metabolic panel  Result Value Ref Range   Sodium 142 135 - 145 mEq/L   Potassium 3.4 (L) 3.5 - 5.3 mEq/L   Chloride 103 96 - 112 mEq/L   CO2 30 19 - 32 mEq/L   Glucose, Bld 93 70 - 99 mg/dL   BUN 12 6 - 23 mg/dL   Creat 1.12 0.50 - 1.35 mg/dL   Total Bilirubin 0.7 0.2 - 1.2 mg/dL   Alkaline Phosphatase 54 39 - 117 U/L   AST 24 0 - 37 U/L   ALT 24 0 - 53 U/L   Total Protein 6.8 6.0 - 8.3 g/dL   Albumin 4.2 3.5 - 5.2 g/dL   Calcium 9.3 8.4 - 10.5 mg/dL  Lipid panel  Result Value Ref Range   Cholesterol 222 (H) 0 - 200 mg/dL   Triglycerides 95 <150 mg/dL   HDL 46 >=40 mg/dL   Total CHOL/HDL Ratio 4.8 Ratio   VLDL 19 0 - 40 mg/dL   LDL Cholesterol 157 (H) 0 - 99 mg/dL    Labs Reviewed - No data  to display  No results found.     ASSESSMENT & PLAN:  1. Non-intractable vomiting with nausea, unspecified vomiting type   Clear liquids tonight.  Return if symptoms persist or worsen  Meds ordered this encounter  Medications  . ondansetron (ZOFRAN-ODT) 8 MG  disintegrating tablet    Sig: Take 1 tablet (8 mg total) by mouth every 8 (eight) hours as needed for nausea.    Dispense:  12 tablet    Refill:  0    Reviewed expectations re: course of current medical issues. Questions answered. Outlined signs and symptoms indicating need for more acute intervention. Patient verbalized understanding. After Visit Summary given.       Robyn Haber, MD 07/18/17 [phone]

## 2017-12-28 ENCOUNTER — Telehealth: Payer: Self-pay | Admitting: Family Medicine

## 2017-12-28 NOTE — Telephone Encounter (Signed)
Dismissal letter in guarantor snapshot  °

## 2020-02-29 ENCOUNTER — Other Ambulatory Visit: Payer: Self-pay

## 2020-02-29 ENCOUNTER — Ambulatory Visit
Admission: EM | Admit: 2020-02-29 | Discharge: 2020-02-29 | Disposition: A | Discharge status: Home / Self Care | Payer: Self-pay | Attending: Emergency Medicine | Admitting: Emergency Medicine

## 2020-02-29 DIAGNOSIS — S161XXA Strain of muscle, fascia and tendon at neck level, initial encounter: Secondary | ICD-10-CM

## 2020-02-29 DIAGNOSIS — M25551 Pain in right hip: Secondary | ICD-10-CM

## 2020-02-29 MED ORDER — IBUPROFEN 800 MG PO TABS: 800.0000 mg | ORAL_TABLET | Freq: Three times a day (TID) | ORAL | 0 refills | Status: AC

## 2020-02-29 MED ORDER — TIZANIDINE HCL 4 MG PO TABS: 2.0000 mg | ORAL_TABLET | Freq: Four times a day (QID) | ORAL | 0 refills | Status: DC | PRN
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
## 2020-02-29 NOTE — Discharge Instructions (Signed)
Please use ibuprofen and Tylenol as needed for pain Alternate ice and heat to areas on neck/hip May supplement with tizanidine which is a muscle relaxer, may cause drowsiness, do not drive or work after taking, limit use to at home/bedtime  Please follow-up if any symptoms not improving or worsening
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
## 2020-08-25 ENCOUNTER — Other Ambulatory Visit: Payer: Self-pay

## 2020-08-25 ENCOUNTER — Emergency Department (HOSPITAL_COMMUNITY): Payer: Self-pay

## 2020-08-25 ENCOUNTER — Emergency Department (HOSPITAL_COMMUNITY)
Admission: EM | Admit: 2020-08-25 | Discharge: 2020-08-26 | Disposition: A | Discharge status: Home / Self Care | Payer: Self-pay | Attending: Emergency Medicine | Admitting: Emergency Medicine

## 2020-08-25 DIAGNOSIS — Z79899 Other long term (current) drug therapy: Secondary | ICD-10-CM | POA: Insufficient documentation

## 2020-08-25 DIAGNOSIS — R112 Nausea with vomiting, unspecified: Secondary | ICD-10-CM | POA: Insufficient documentation

## 2020-08-25 DIAGNOSIS — I10 Essential (primary) hypertension: Secondary | ICD-10-CM | POA: Insufficient documentation

## 2020-08-25 DIAGNOSIS — R42 Dizziness and giddiness: Secondary | ICD-10-CM | POA: Insufficient documentation

## 2020-08-25 DIAGNOSIS — R519 Headache, unspecified: Secondary | ICD-10-CM | POA: Insufficient documentation

## 2020-08-25 DIAGNOSIS — R531 Weakness: Secondary | ICD-10-CM | POA: Insufficient documentation

## 2020-08-25 DIAGNOSIS — Z7982 Long term (current) use of aspirin: Secondary | ICD-10-CM | POA: Insufficient documentation

## 2020-08-25 LAB — CBG MONITORING, ED: Glucose-Capillary: 167 mg/dL — ABNORMAL HIGH (ref 70–99)

## 2020-08-25 IMAGING — CT CT HEAD W/O CM
4 series · 15 of 47 positions shown, 17 images · non-contrast
Comparison: None.

CLINICAL DATA: dizziness all day after awaking with a HA this a.m.,
worsening after [DATE]. unable to open eyes without vomiting
(nystagmus witnessed), worked outside all day.

EXAM:
CT HEAD WITHOUT CONTRAST
TECHNIQUE: Contiguous axial images were obtained from the base of the skull
through the vertex without intravenous contrast.

[Series 3: head without · axial · non-contrast · 0.45mm/px · z∈[+1126,+1246]mm · 7 of 34 slices shown, 9 images]
[im 5/34  brain]
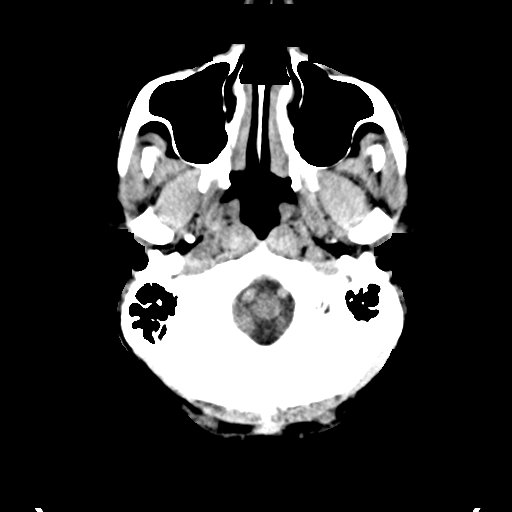
[im 5/34  bone]
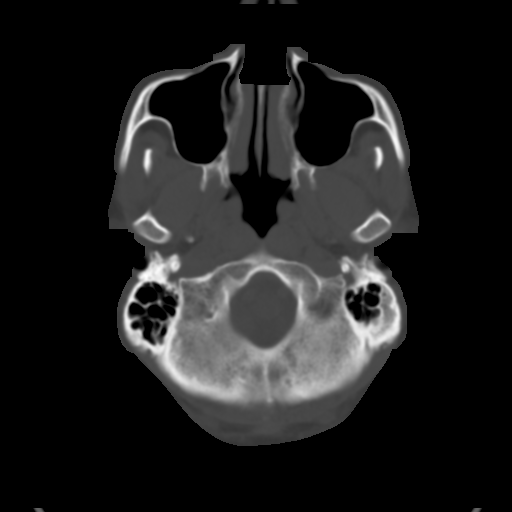
[im 9/34  brain]
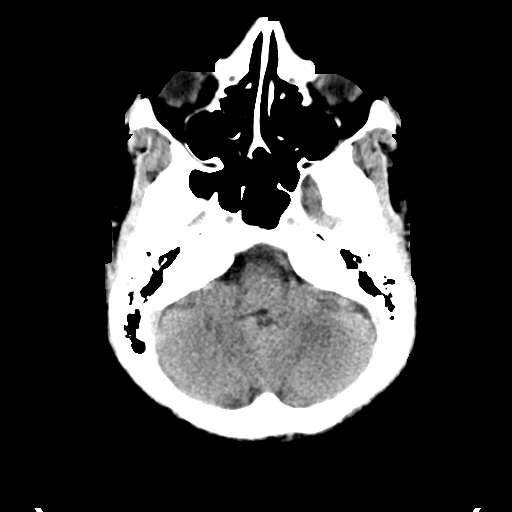
[im 13/34  brain]
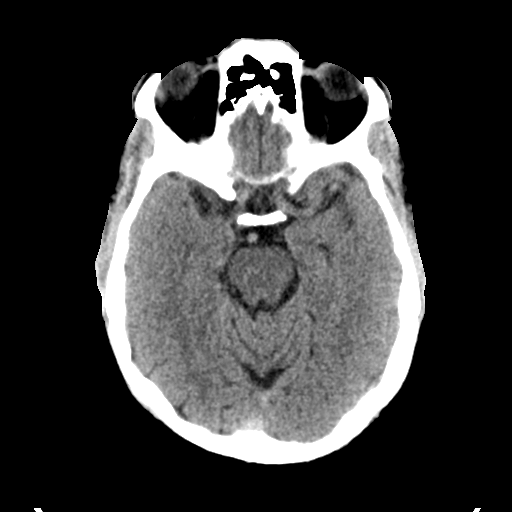
[im 17/34  brain]
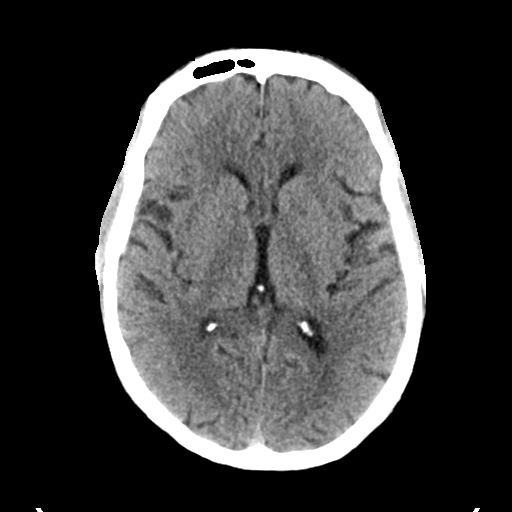
[im 21/34  brain]
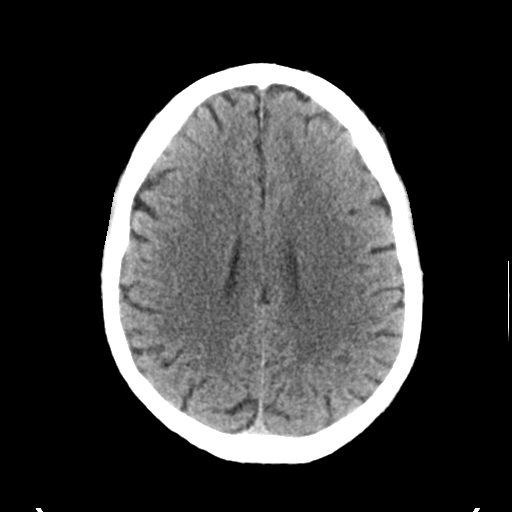
[im 21/34  bone]
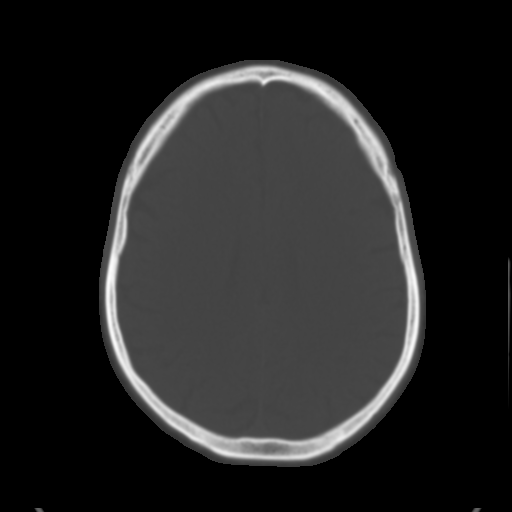
[im 25/34  brain]
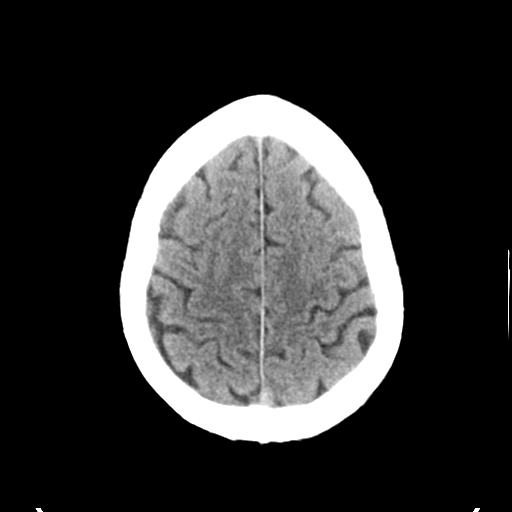
[im 29/34  brain]
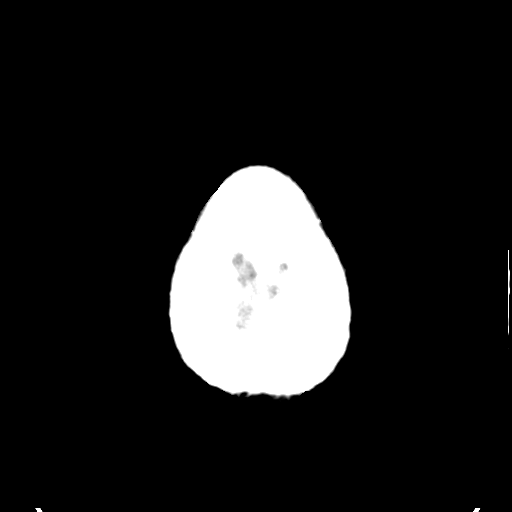

[Series 4: head bone · axial · 0.45mm/px · z∈[+1122,+1138]mm · 2 of 83 slices shown]
[im 9/83  bone]
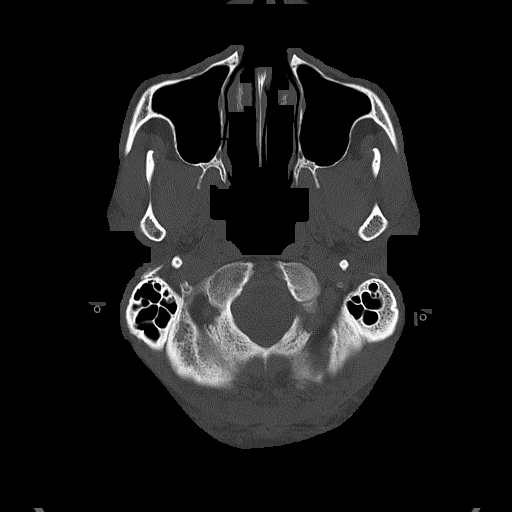
[im 17/83  bone]
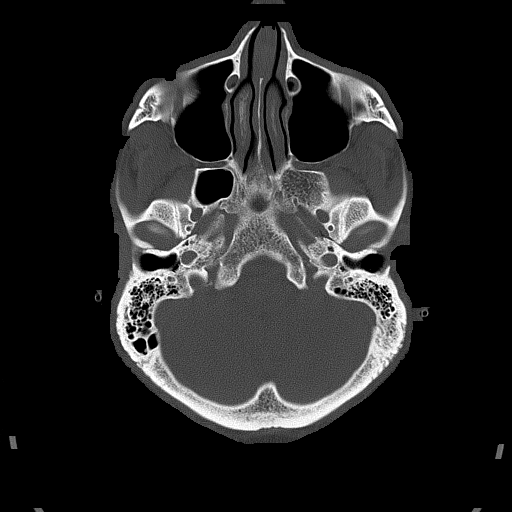

[Series 5: head without cor · coronal · non-contrast · 0.31mm/px · 3 of 75 slices shown]
[im 25/75  brain]
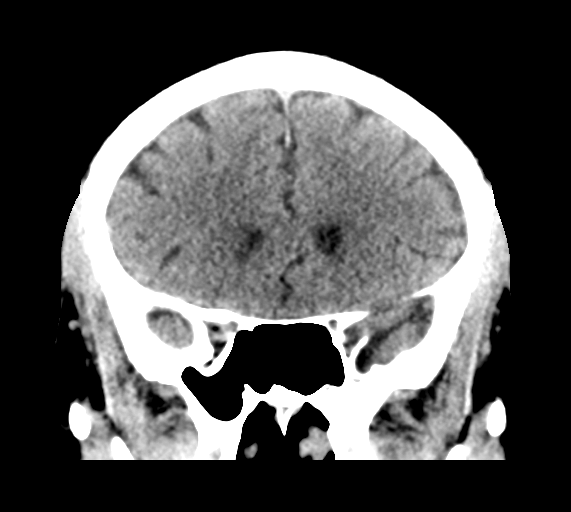
[im 33/75  brain]
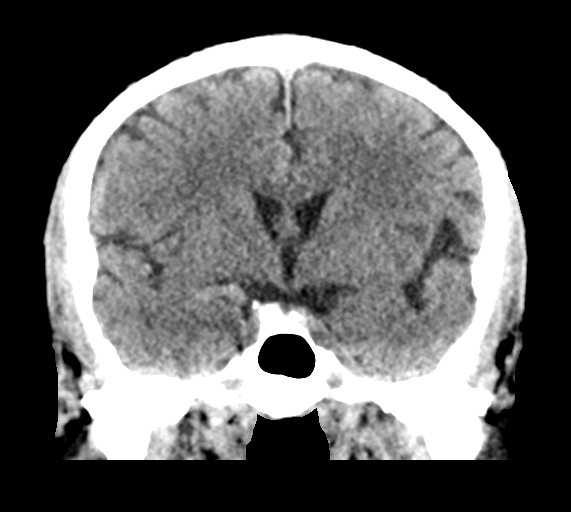
[im 42/75  brain]
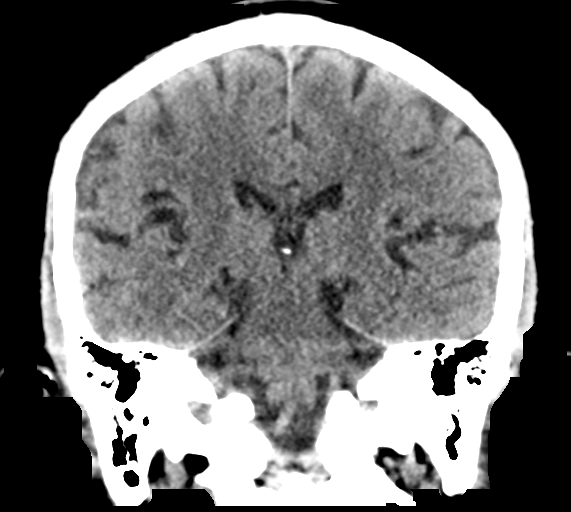

[Series 6: head without sag · sagittal · non-contrast · 0.31mm/px · 3 of 60 slices shown]
[im 20/60  brain]
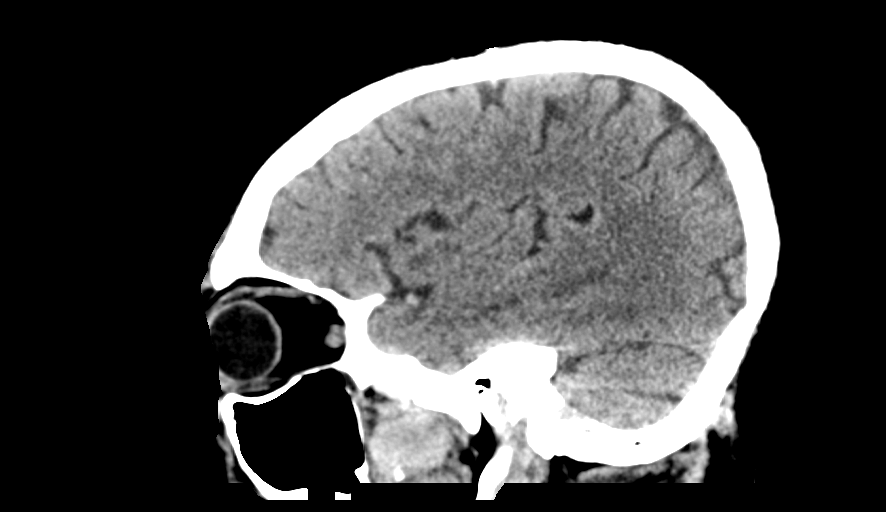
[im 30/60  brain]
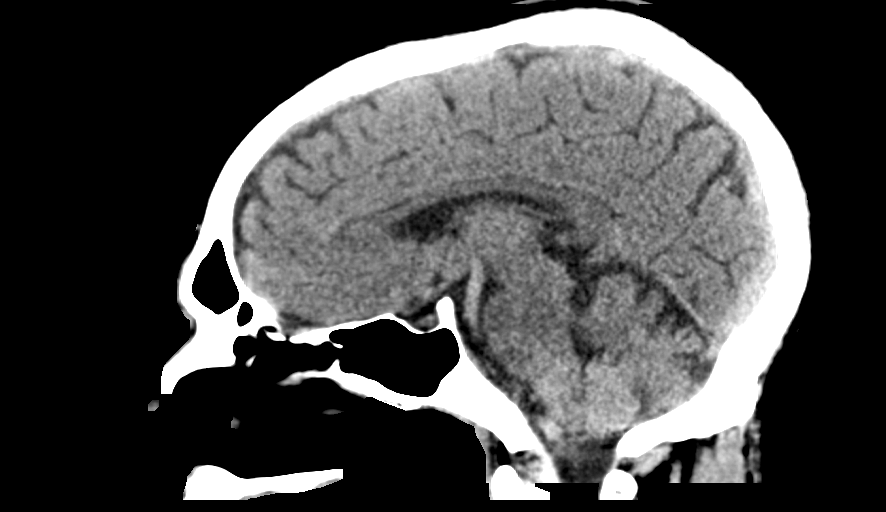
[im 40/60  brain]
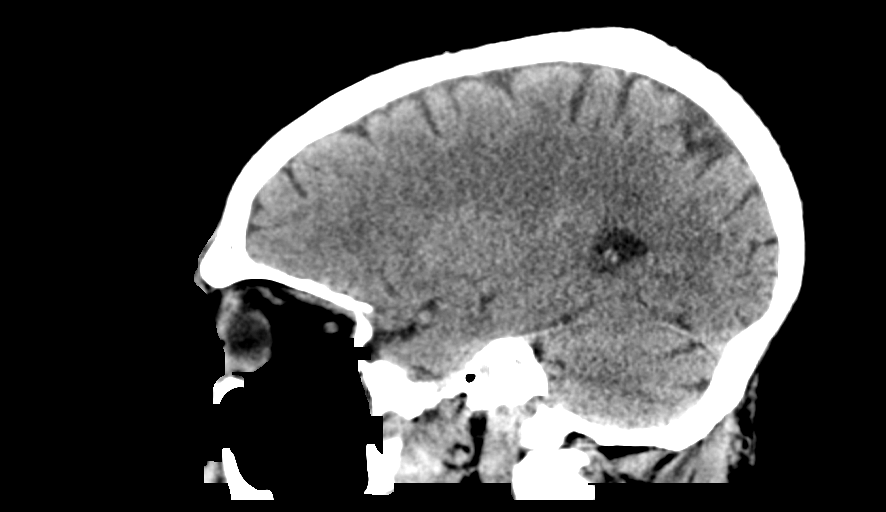

[15 of 47 positions shown; findings below may reference images not displayed]

FINDINGS: Brain:

No evidence of large-territorial acute infarction. No parenchymal
hemorrhage. No mass lesion. No extra-axial collection.

No mass effect or midline shift. No hydrocephalus. Basilar cisterns
are patent.

Vascular: No hyperdense vessel.

Skull: No acute fracture or focal lesion.

Sinuses/Orbits: Paranasal sinuses and mastoid air cells are clear.
The orbits are unremarkable.

Other: None.
IMPRESSION: No acute intracranial abnormality.

## 2020-08-25 NOTE — ED Triage Notes (Signed)
Pt c/o dizziness all day after awaking with a HA this a.m.,  worsening after 21:30. Pt is unable to open eyes without vomiting (nystagmus witnessed), worked outside all day. Was given '4mg'$  Zofran IVP.

## 2020-08-26 ENCOUNTER — Emergency Department (HOSPITAL_COMMUNITY): Payer: Self-pay

## 2020-08-26 LAB — COMPREHENSIVE METABOLIC PANEL
ALT: 22 U/L (ref 0–44)
AST: 27 U/L (ref 15–41)
Albumin: 4.1 g/dL (ref 3.5–5.0)
Alkaline Phosphatase: 57 U/L (ref 38–126)
Anion gap: 11 (ref 5–15)
BUN: 8 mg/dL (ref 6–20)
CO2: 24 mmol/L (ref 22–32)
Calcium: 8.9 mg/dL (ref 8.9–10.3)
Chloride: 106 mmol/L (ref 98–111)
Creatinine, Ser: 1.33 mg/dL — ABNORMAL HIGH (ref 0.61–1.24)
GFR, Estimated: >60 mL/min (ref >60)
Glucose, Bld: 178 mg/dL — ABNORMAL HIGH (ref 70–99)
Potassium: 2.7 mmol/L — CL (ref 3.5–5.1)
Sodium: 141 mmol/L (ref 135–145)
Total Bilirubin: 1.4 mg/dL — ABNORMAL HIGH (ref 0.3–1.2)
Total Protein: 7.3 g/dL (ref 6.5–8.1)

## 2020-08-26 LAB — CBC WITH DIFFERENTIAL/PLATELET
Abs Immature Granulocytes: 0.07 10*3/uL (ref 0.00–0.07)
Basophils Absolute: 0 10*3/uL (ref 0.0–0.1)
Basophils Relative: 0 %
Eosinophils Absolute: 0 10*3/uL (ref 0.0–0.5)
Eosinophils Relative: 0 %
HCT: 37.7 % — ABNORMAL LOW (ref 39.0–52.0)
Hemoglobin: 12.8 g/dL — ABNORMAL LOW (ref 13.0–17.0)
Immature Granulocytes: 1 %
Lymphocytes Relative: 7 %
Lymphs Abs: 0.9 10*3/uL (ref 0.7–4.0)
MCH: 30.8 pg (ref 26.0–34.0)
MCHC: 34 g/dL (ref 30.0–36.0)
MCV: 90.8 fL (ref 80.0–100.0)
Monocytes Absolute: 0.6 10*3/uL (ref 0.1–1.0)
Monocytes Relative: 5 %
Neutro Abs: 12.4 10*3/uL — ABNORMAL HIGH (ref 1.7–7.7)
Neutrophils Relative %: 87 %
Platelets: 254 10*3/uL (ref 150–400)
RBC: 4.15 MIL/uL — ABNORMAL LOW (ref 4.22–5.81)
RDW: 13 % (ref 11.5–15.5)
WBC: 14 10*3/uL — ABNORMAL HIGH (ref 4.0–10.5)
nRBC: 0 % (ref 0.0–0.2)

## 2020-08-26 LAB — URINALYSIS, ROUTINE W REFLEX MICROSCOPIC
Bilirubin Urine: NEGATIVE
Glucose, UA: NEGATIVE mg/dL
Hgb urine dipstick: NEGATIVE
Ketones, ur: 15 mg/dL — AB
Leukocytes,Ua: NEGATIVE
Nitrite: NEGATIVE
Protein, ur: NEGATIVE mg/dL
Specific Gravity, Urine: 1.015 (ref 1.005–1.030)
pH: 7 (ref 5.0–8.0)

## 2020-08-26 LAB — TROPONIN I (HIGH SENSITIVITY)
Troponin I (High Sensitivity): 4 ng/L (ref <18)
Troponin I (High Sensitivity): 6 ng/L (ref <18)

## 2020-08-26 LAB — LACTIC ACID, PLASMA: Lactic Acid, Venous: 2.1 mmol/L (ref 0.5–1.9)

## 2020-08-26 LAB — CK: Total CK: 528 U/L — ABNORMAL HIGH (ref 49–397)

## 2020-08-26 IMAGING — MR MR MRA HEAD W/O CM
1 series · 19 of 48 positions shown · IV contrast (gadavist)
Comparison: No pertinent prior exam.

CLINICAL DATA: Dizziness after waking with headache this morning.
Vertebral dissection suspected.

EXAM:
MRI HEAD WITHOUT CONTRAST
MRA HEAD WITHOUT CONTRAST
MRA OF THE NECK WITHOUT AND WITH CONTRAST
TECHNIQUE: Multiplanar, multi-echo pulse sequences of the brain and surrounding
structures were acquired without intravenous contrast. Angiographic
images of the Circle of Willis were acquired using MRA technique
without intravenous contrast. Angiographic images of the neck were
acquired using MRA technique without and with intravenous contrast.
Carotid stenosis measurements (when applicable) are obtained
utilizing NASCET criteria, using the distal internal carotid
diameter as the denominator.
CONTRAST:  10mL GADAVIST GADOBUTROL 1 MMOL/ML IV SOLN

[Series 9: 3d cow · axial · 0.5mm · 0.41mm/px · z∈[-93,-11]mm · 19 of 172 slices shown]
[im 1/172]
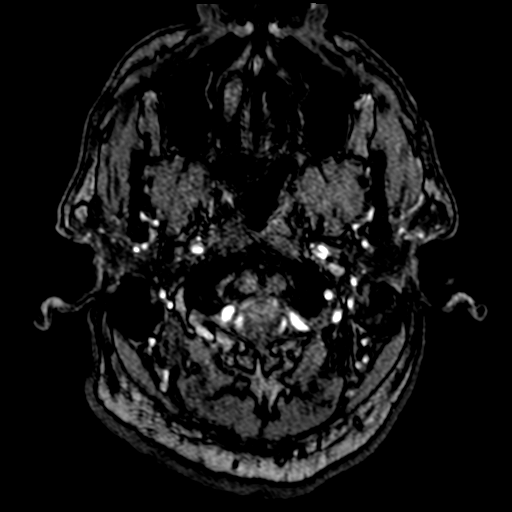
[im 4/172]
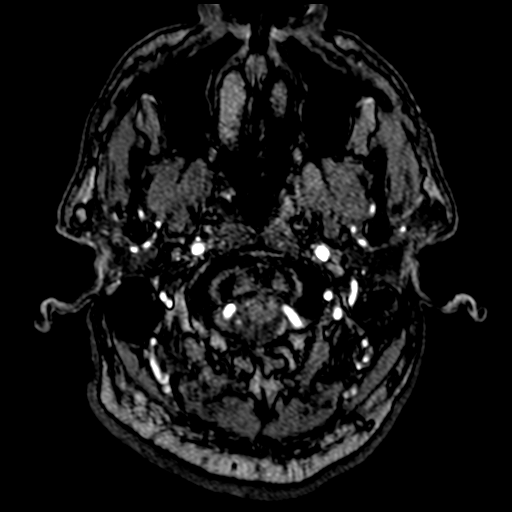
[im 8/172]
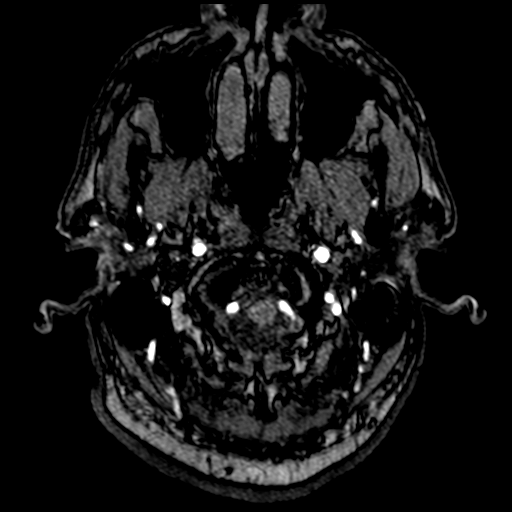
[im 11/172]
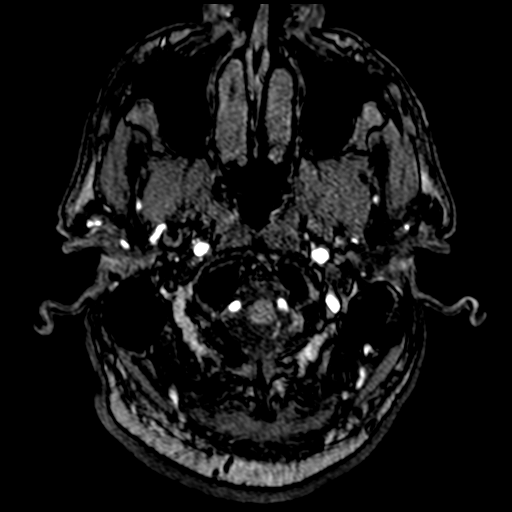
[im 15/172]
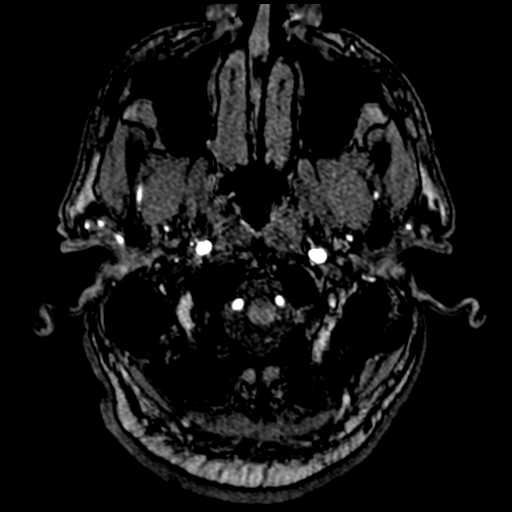
[im 19/172]
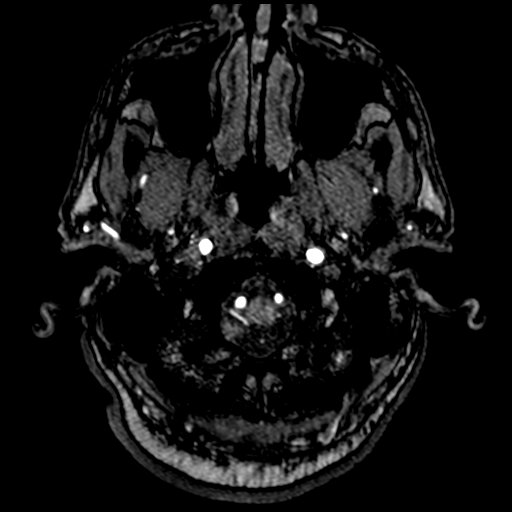
[im 22/172]
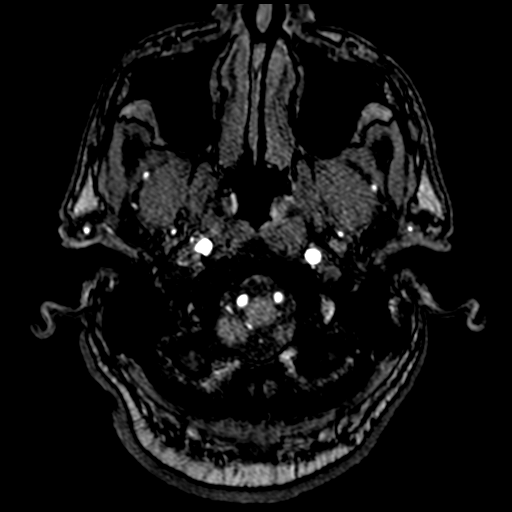
[im 26/172]
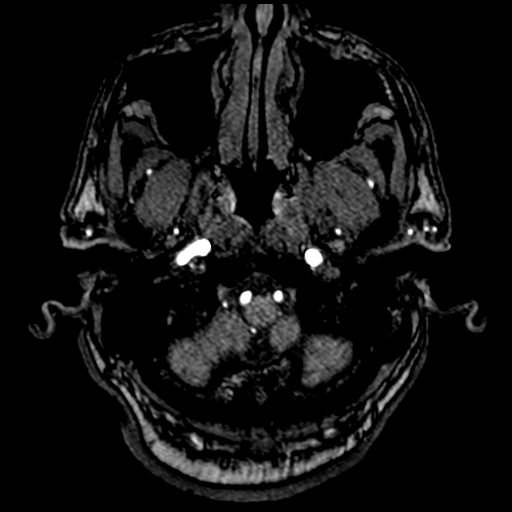
[im 30/172]
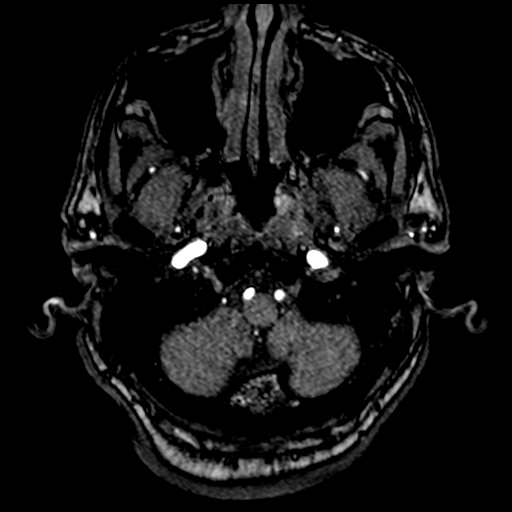
[im 33/172]
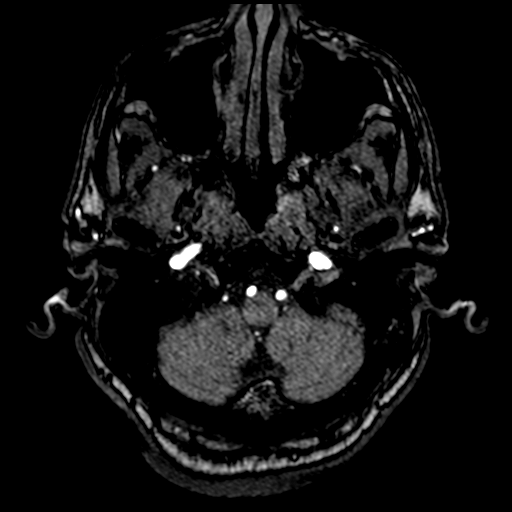
[im 37/172]
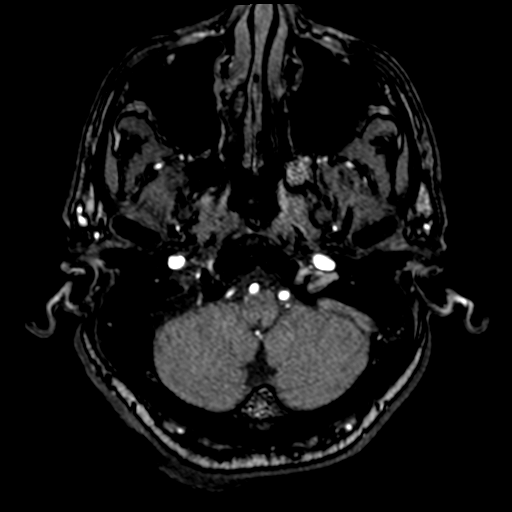
[im 55/172]
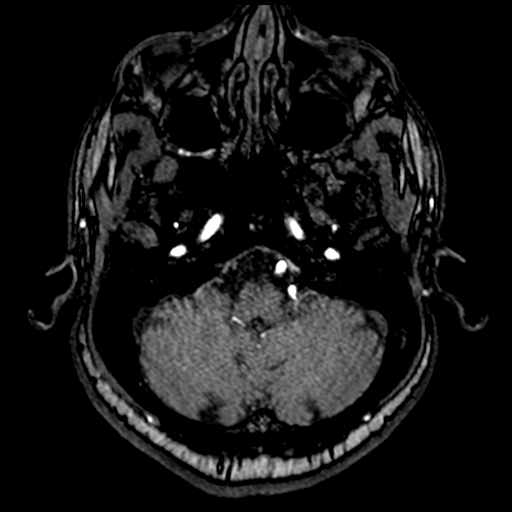
[im 77/172]
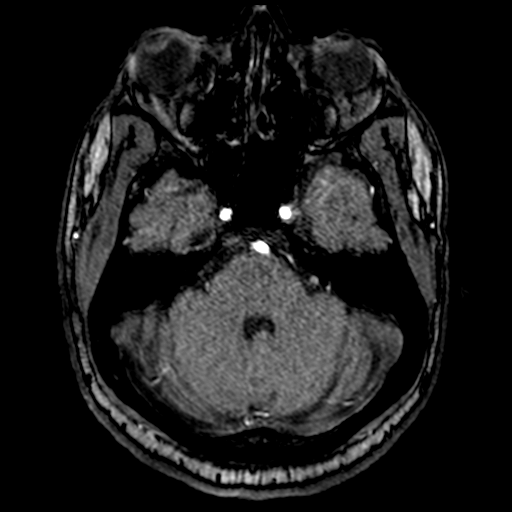
[im 88/172]
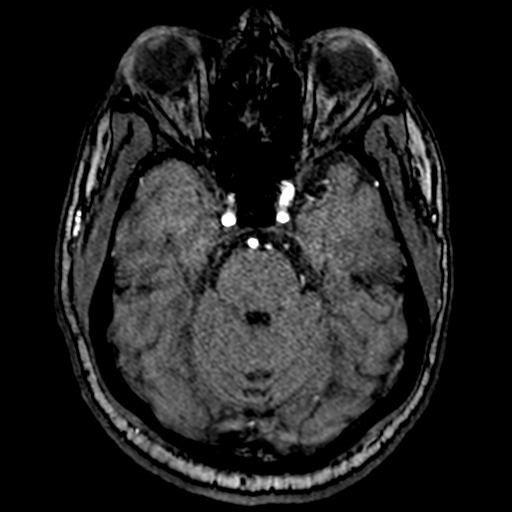
[im 99/172]
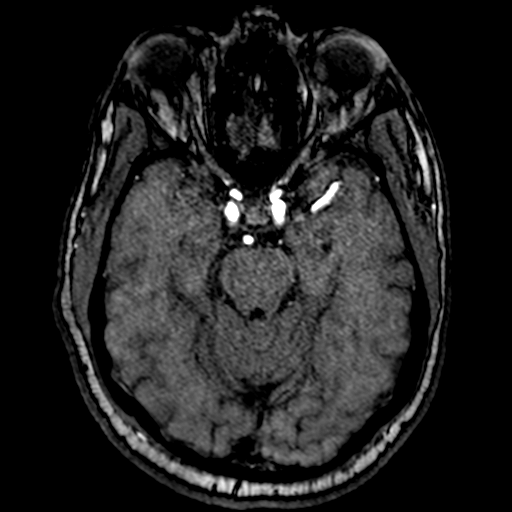
[im 121/172]
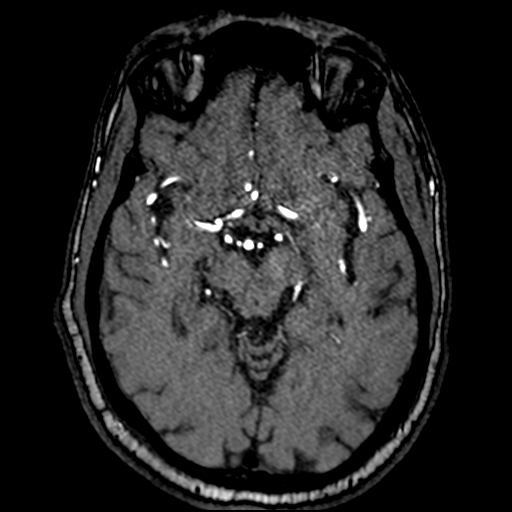
[im 142/172]
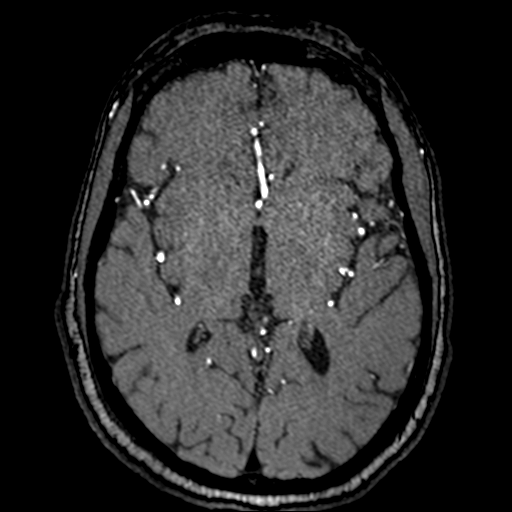
[im 146/172]
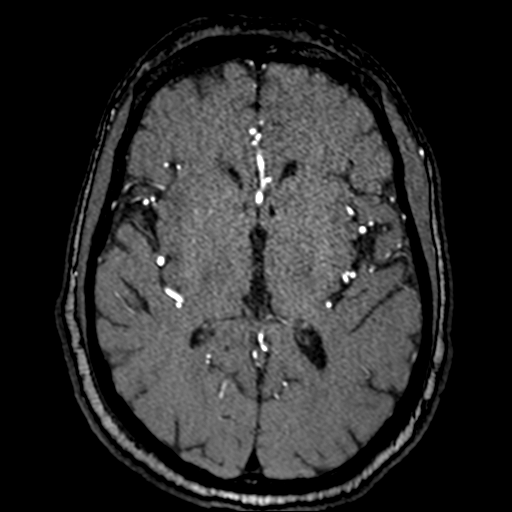
[im 164/172]
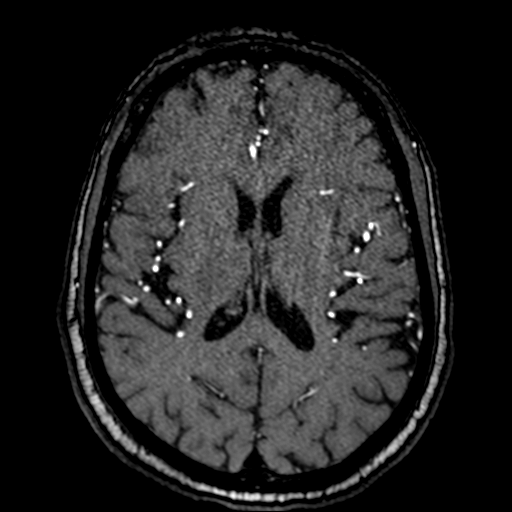

[19 of 48 positions shown; findings below may reference images not displayed]

FINDINGS: MR HEAD FINDINGS

Brain: No acute infarction, hemorrhage, hydrocephalus, extra-axial
collection or mass lesion. Mild chronic small vessel ischemic type
change in the hemispheric white matter.

Vascular: See below

Skull and upper cervical spine: Expansile lesion in the left
paramedian parietal bone with circumscribed margins by MRI, up to 2
cm. No significant change from [V6], most consistent with fibrous
dysplasia based on CT matrix.

Sinuses/Orbits: Negative.

MRA HEAD FINDINGS

Anterior circulation: Vessels are smoothly contoured and widely
patent. No evidence of vascular malformation or aneurysm.

Posterior circulation: Vertebral and basilar arteries are smooth and
widely patent. At the left V3 segment there is partial obscuration
due to intravenous contrast, but normal appearing by time-of-flight
technique. No branch occlusion, beading, or aneurysm.

Anatomic variants:None significant

MRA NECK FINDINGS

Aortic arch: Negative

Right carotid system: Vessels are smooth and diffusely patent

Left carotid system: Vessels are smooth and diffusely patent.

Vertebral arteries: No proximal subclavian or vertebral stenosis or
beading.

Other: Antegrade flow in carotid and vertebral arteries by
noncontrast technique.
IMPRESSION: Brain MRI:

No acute finding.

Intracranial and neck MRA:

Negative.

## 2020-08-26 IMAGING — MR MR HEAD W/O CM
12 of 13 series · 44 of 48 positions shown · IV contrast (gadavist)
Comparison: No pertinent prior exam.

CLINICAL DATA: Dizziness after waking with headache this morning.
Vertebral dissection suspected.

EXAM:
MRI HEAD WITHOUT CONTRAST
MRA HEAD WITHOUT CONTRAST
MRA OF THE NECK WITHOUT AND WITH CONTRAST
TECHNIQUE: Multiplanar, multi-echo pulse sequences of the brain and surrounding
structures were acquired without intravenous contrast. Angiographic
images of the Circle of Willis were acquired using MRA technique
without intravenous contrast. Angiographic images of the neck were
acquired using MRA technique without and with intravenous contrast.
Carotid stenosis measurements (when applicable) are obtained
utilizing NASCET criteria, using the distal internal carotid
diameter as the denominator.
CONTRAST:  10mL GADAVIST GADOBUTROL 1 MMOL/ML IV SOLN

[Series 5: DWI · axial · 3.0mm · 0.88mm/px · z∈[-104,+48]mm · 7 of 104 slices shown (1 of 4)]
[im 1/104]
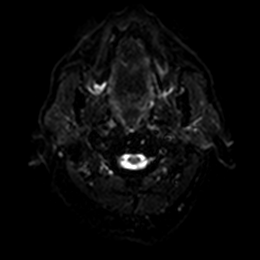
[im 18/104]
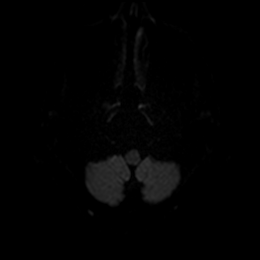
[im 35/104]
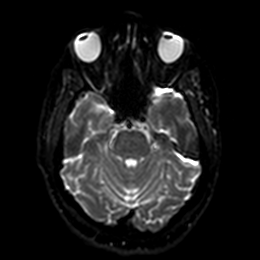
[im 52/104]
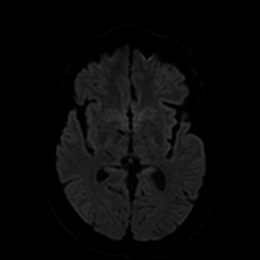
[im 69/104]
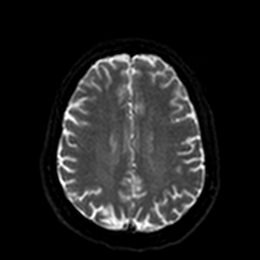
[im 86/104]
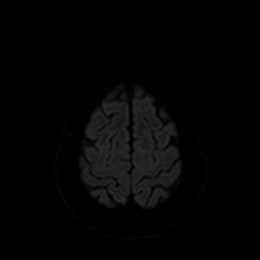
[im 104/104]
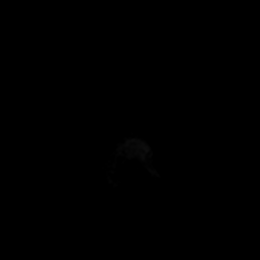

[Series 6: DWI · axial · 3.0mm · 0.88mm/px · z∈[-104,+48]mm · 4 of 52 slices shown (2 of 4)]
[im 1/52]
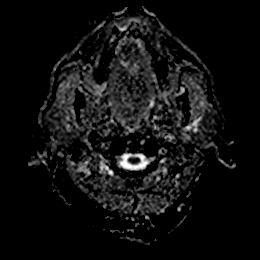
[im 18/52]
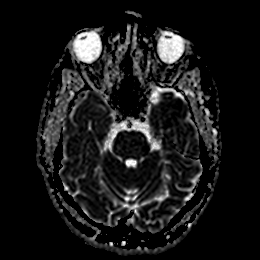
[im 35/52]
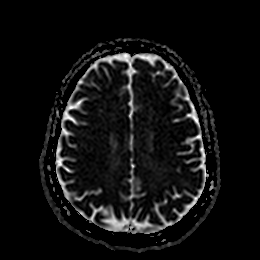
[im 52/52]
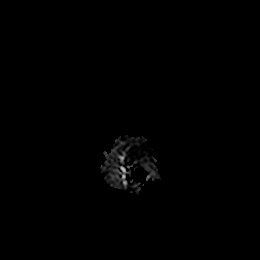

[Series 7: DWI · coronal · 4.0mm · 0.88mm/px · 6 of 78 slices shown (3 of 4)]
[im 1/78]
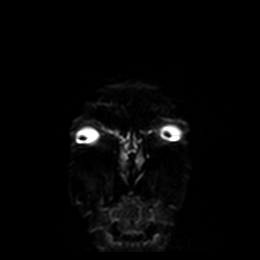
[im 16/78]
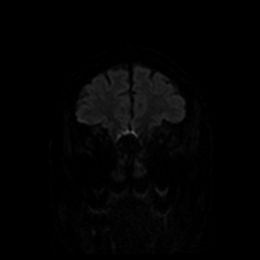
[im 31/78]
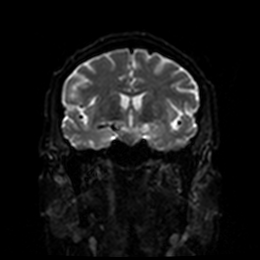
[im 47/78]
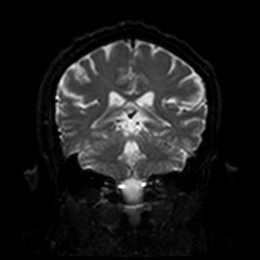
[im 62/78]
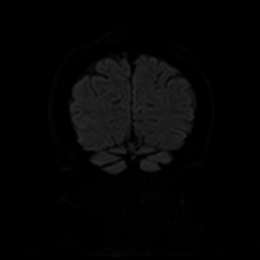
[im 78/78]
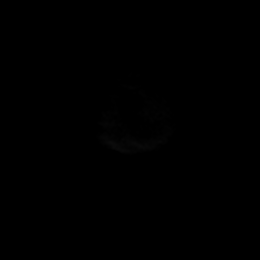

[Series 8: DWI · coronal · 4.0mm · 0.88mm/px · 3 of 39 slices shown (4 of 4)]
[im 1/39]
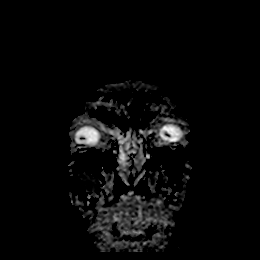
[im 20/39]
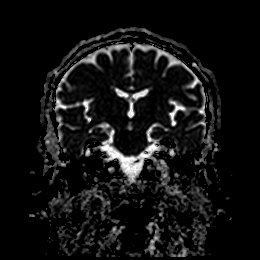
[im 39/39]
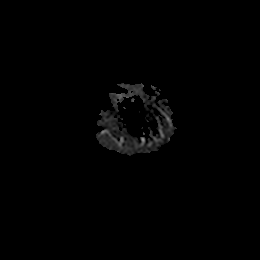

[Series 13: T1 · sagittal · 5.0mm · 0.75mm/px · 2 of 26 slices shown]
[im 1/26]
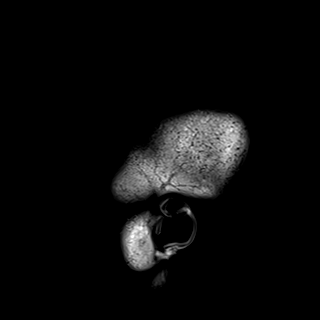
[im 26/26]
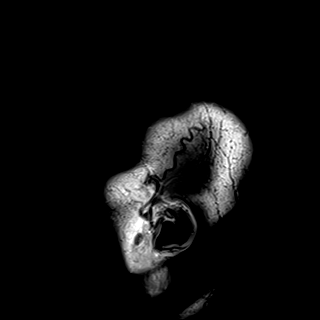

[Series 14: T2 · axial · 5.0mm · 0.72mm/px · z∈[-106,+49]mm · 2 of 27 slices shown (1 of 2)]
[im 1/27]
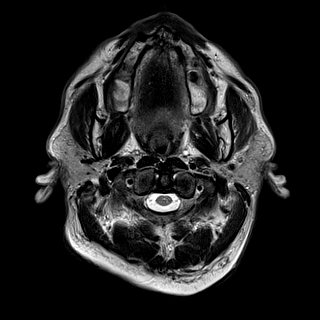
[im 27/27]
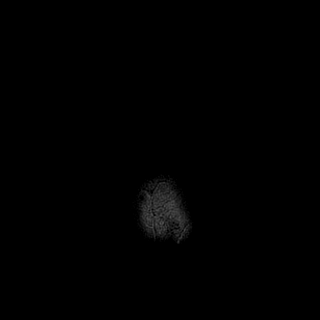

[Series 15: FLAIR · axial · 5.0mm · 0.45mm/px · z∈[-106,+49]mm · 2 of 27 slices shown]
[im 1/27]
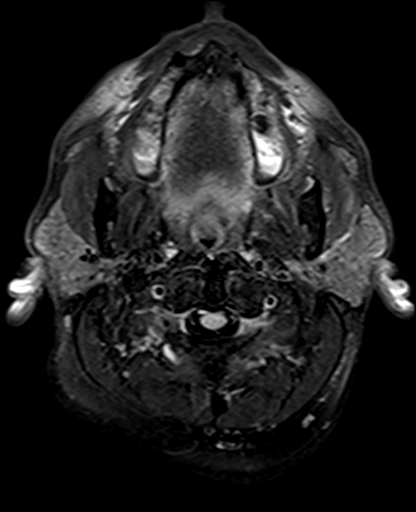
[im 27/27]
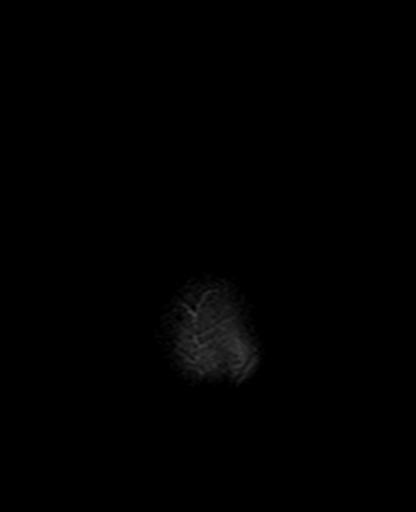

[Series 16: mag_images · axial · 3.0mm · 0.90mm/px · z∈[-110,+54]mm · 4 of 56 slices shown]
[im 1/56]
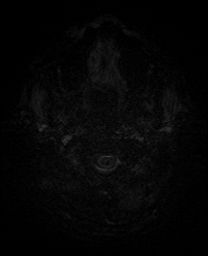
[im 19/56]
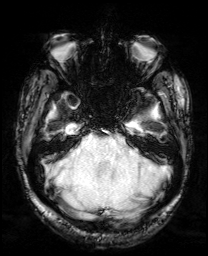
[im 37/56]
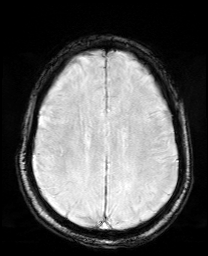
[im 56/56]
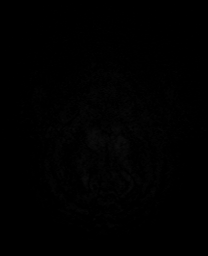

[Series 17: pha_images · axial · 3.0mm · 0.90mm/px · z∈[-110,+48]mm · 4 of 54 slices shown]
[im 1/54]
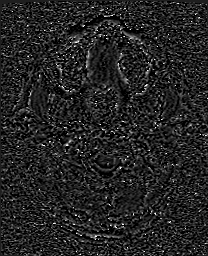
[im 18/54]
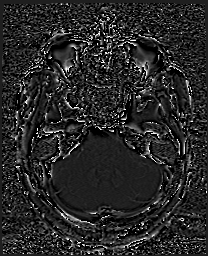
[im 36/54]
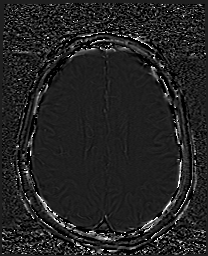
[im 54/54]
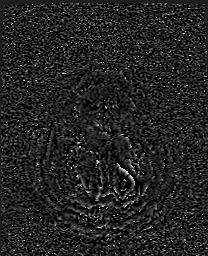

[Series 18: swi_images · axial · 3.0mm · 0.90mm/px · z∈[-110,+54]mm · 4 of 56 slices shown]
[im 1/56]
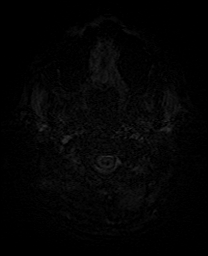
[im 19/56]
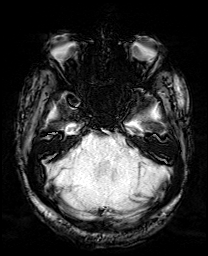
[im 37/56]
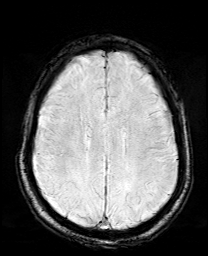
[im 56/56]
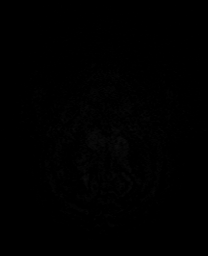

[Series 19: mip_images(sw) · axial · 24.0mm · 0.90mm/px · z∈[-100,+43]mm · 4 of 49 slices shown]
[im 1/49]
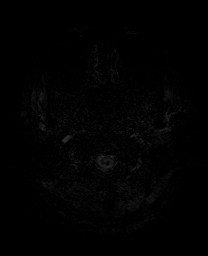
[im 17/49]
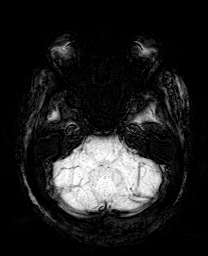
[im 33/49]
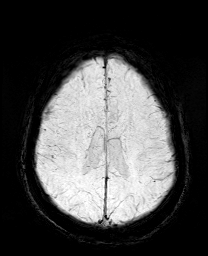
[im 49/49]
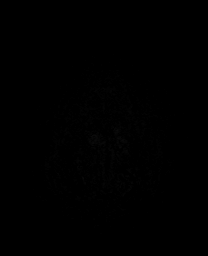

[Series 21: T2 · coronal · 5.0mm · 0.34mm/px · 2 of 31 slices shown (2 of 2)]
[im 1/31]
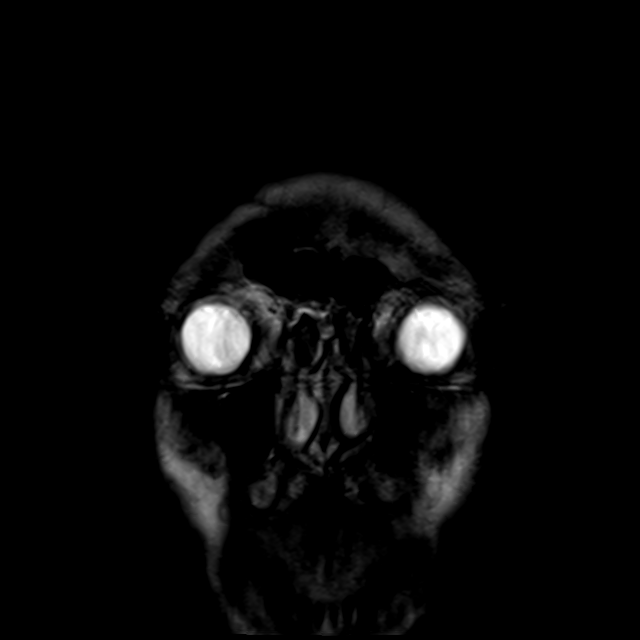
[im 31/31]
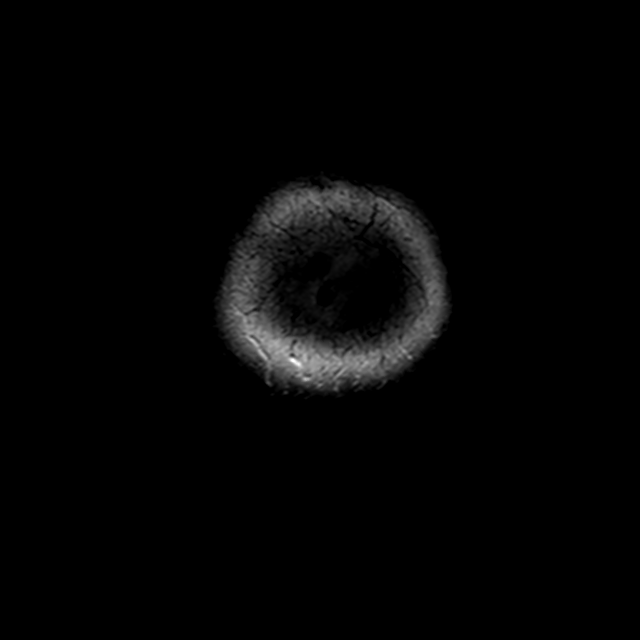

[44 of 48 positions shown; findings below may reference images not displayed]

FINDINGS: MR HEAD FINDINGS

Brain: No acute infarction, hemorrhage, hydrocephalus, extra-axial
collection or mass lesion. Mild chronic small vessel ischemic type
change in the hemispheric white matter.

Vascular: See below

Skull and upper cervical spine: Expansile lesion in the left
paramedian parietal bone with circumscribed margins by MRI, up to 2
cm. No significant change from [V6], most consistent with fibrous
dysplasia based on CT matrix.

Sinuses/Orbits: Negative.

MRA HEAD FINDINGS

Anterior circulation: Vessels are smoothly contoured and widely
patent. No evidence of vascular malformation or aneurysm.

Posterior circulation: Vertebral and basilar arteries are smooth and
widely patent. At the left V3 segment there is partial obscuration
due to intravenous contrast, but normal appearing by time-of-flight
technique. No branch occlusion, beading, or aneurysm.

Anatomic variants:None significant

MRA NECK FINDINGS

Aortic arch: Negative

Right carotid system: Vessels are smooth and diffusely patent

Left carotid system: Vessels are smooth and diffusely patent.

Vertebral arteries: No proximal subclavian or vertebral stenosis or
beading.

Other: Antegrade flow in carotid and vertebral arteries by
noncontrast technique.
IMPRESSION: Brain MRI:

No acute finding.

Intracranial and neck MRA:

Negative.

## 2020-08-26 IMAGING — MR MR MRA NECK WO/W CM
8 series · 43 of 48 positions shown · IV contrast (gadavist)
Comparison: No pertinent prior exam.

CLINICAL DATA: Dizziness after waking with headache this morning.
Vertebral dissection suspected.

EXAM:
MRI HEAD WITHOUT CONTRAST
MRA HEAD WITHOUT CONTRAST
MRA OF THE NECK WITHOUT AND WITH CONTRAST
TECHNIQUE: Multiplanar, multi-echo pulse sequences of the brain and surrounding
structures were acquired without intravenous contrast. Angiographic
images of the Circle of Willis were acquired using MRA technique
without intravenous contrast. Angiographic images of the neck were
acquired using MRA technique without and with intravenous contrast.
Carotid stenosis measurements (when applicable) are obtained
utilizing NASCET criteria, using the distal internal carotid
diameter as the denominator.
CONTRAST:  10mL GADAVIST GADOBUTROL 1 MMOL/ML IV SOLN

[Series 24: tof_fl3d_tra_iso · axial · 0.6mm · 0.52mm/px · z∈[-181,-102]mm · 6 of 133 slices shown]
[im 1/133]
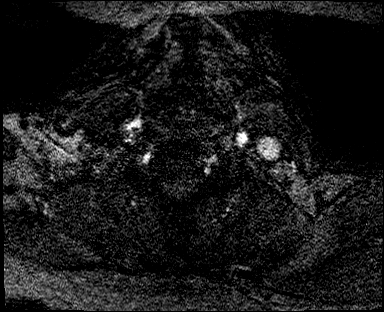
[im 27/133]
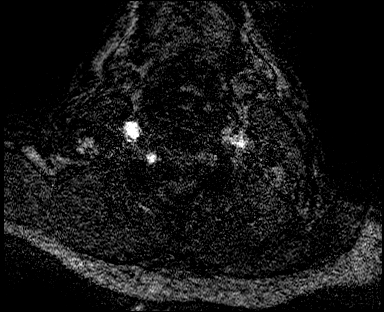
[im 53/133]
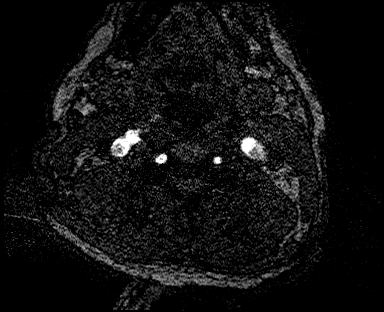
[im 80/133]
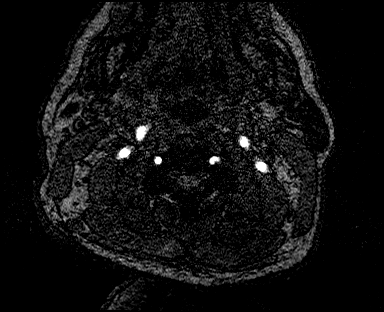
[im 106/133]
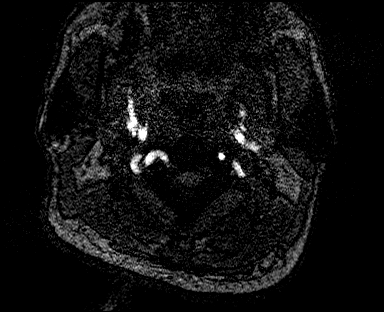
[im 133/133]
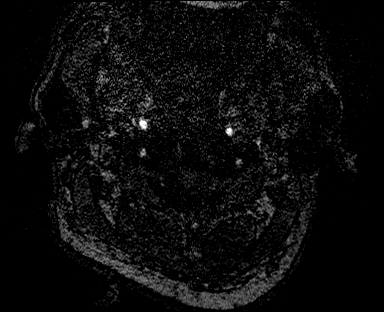

[Series 27: angio_fl3d_cor_pre_ttc=3.0s · coronal · 0.9mm · 0.85mm/px · 6 of 104 slices shown]
[im 1/104]
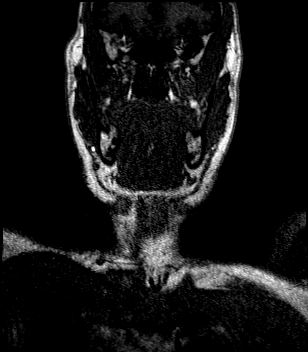
[im 21/104]
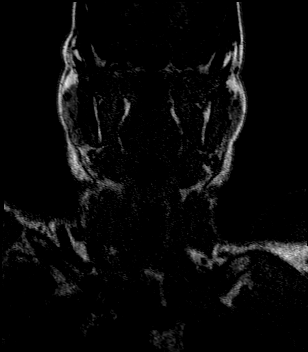
[im 42/104]
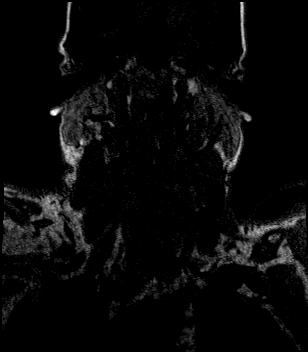
[im 62/104]
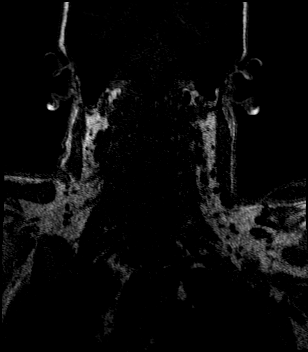
[im 83/104]
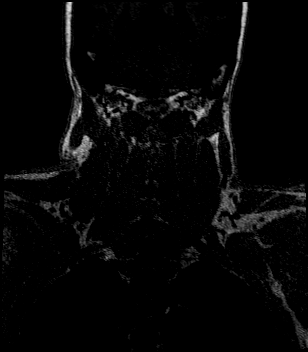
[im 104/104]
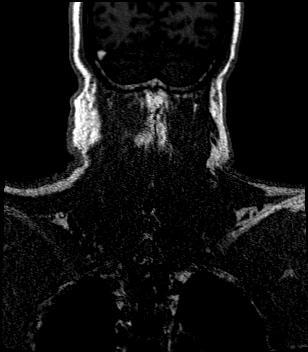

[Series 33: angio_fl3d_cor_post_ttc=3.0s · coronal · 0.9mm · 0.85mm/px · 6 of 104 slices shown (1 of 2)]
[im 1/104]
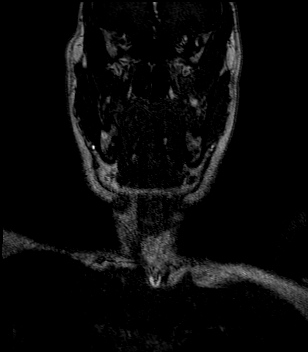
[im 21/104]
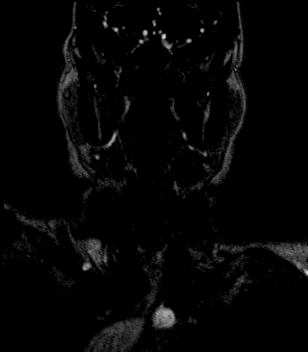
[im 42/104]
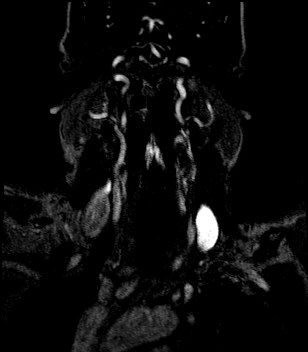
[im 62/104]
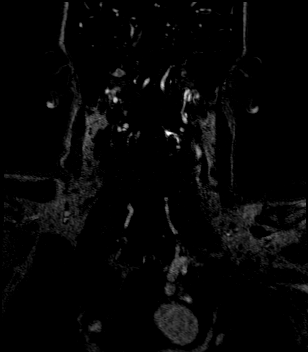
[im 83/104]
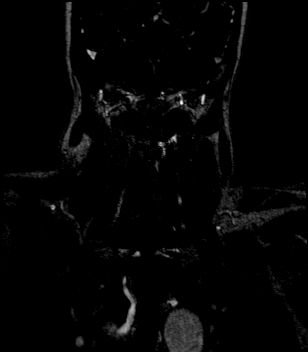
[im 104/104]
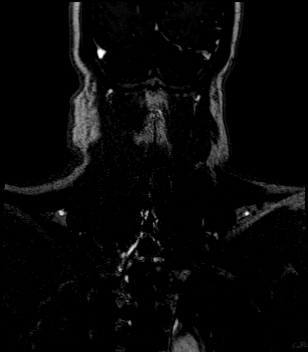

[Series 34: angio_fl3d_cor_post_ttc=3.0s_moco-adv · coronal · 0.9mm · 0.85mm/px · 6 of 104 slices shown (1 of 2)]
[im 1/104]
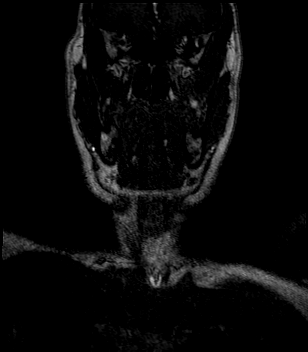
[im 21/104]
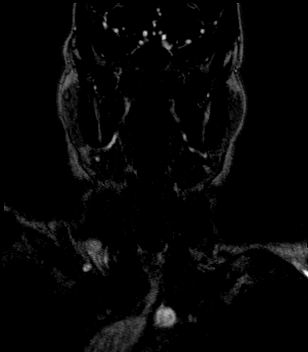
[im 42/104]
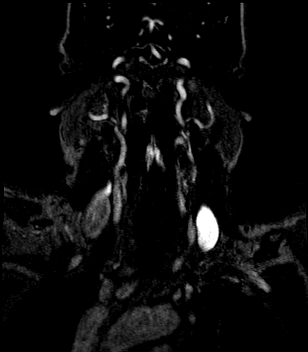
[im 62/104]
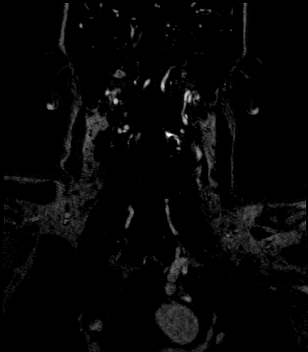
[im 83/104]
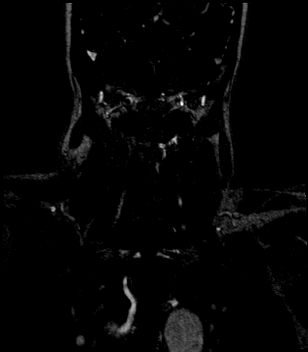
[im 104/104]
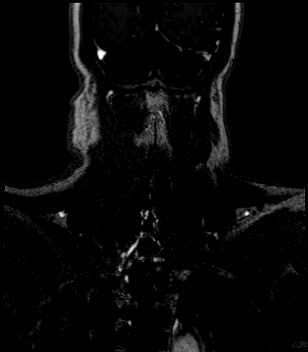

[Series 35: angio_fl3d_cor_post_ttc=3.0s_moco-adv_sub · coronal · 0.9mm · 0.85mm/px · 6 of 104 slices shown (1 of 2)]
[im 1/104]
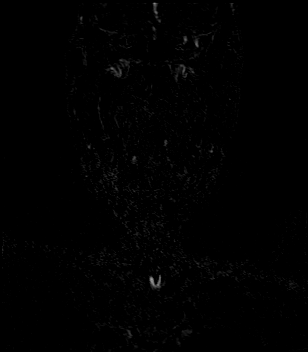
[im 21/104]
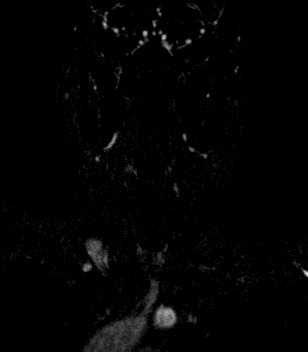
[im 42/104]
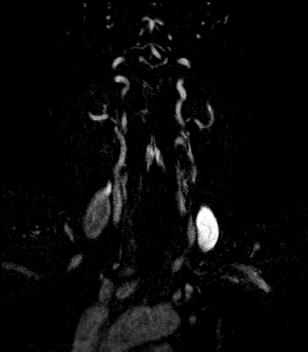
[im 62/104]
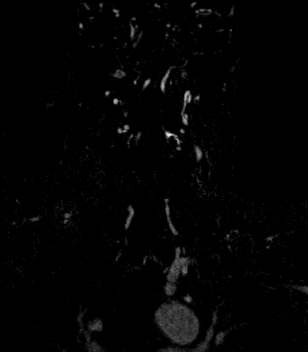
[im 83/104]
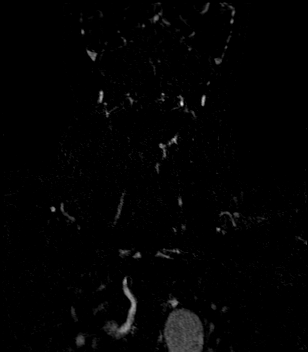
[im 104/104]
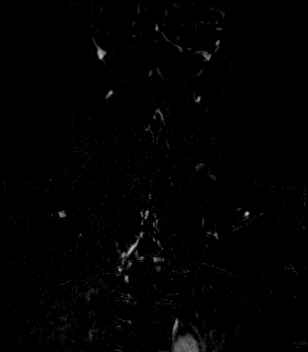

[Series 37: angio_fl3d_cor_post_ttc=3.0s · coronal · 0.9mm · 0.85mm/px · 6 of 104 slices shown (2 of 2)]
[im 1/104]
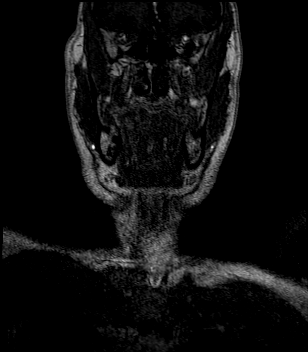
[im 21/104]
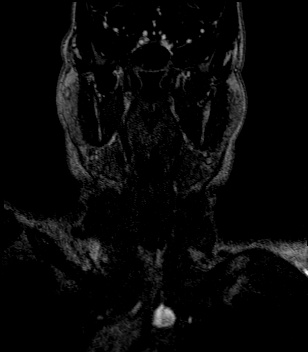
[im 42/104]
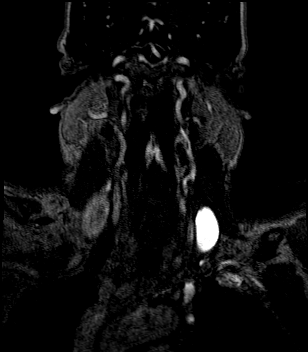
[im 62/104]
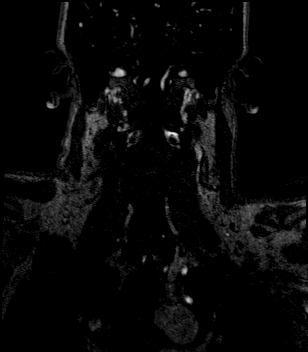
[im 83/104]
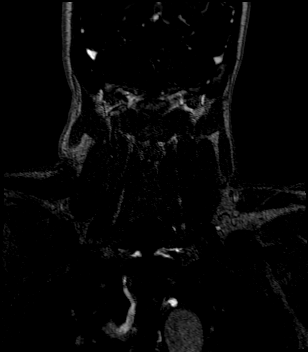
[im 104/104]
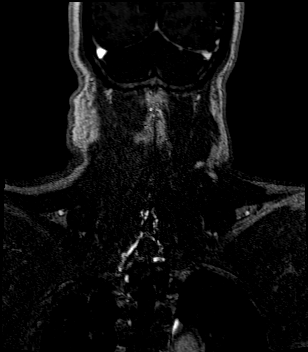

[Series 38: angio_fl3d_cor_post_ttc=3.0s_moco-adv · coronal · 0.9mm · 0.85mm/px · 6 of 104 slices shown (2 of 2)]
[im 1/104]
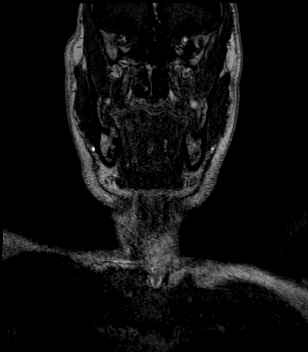
[im 21/104]
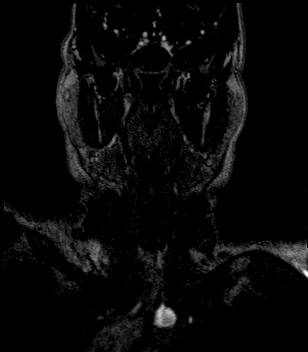
[im 42/104]
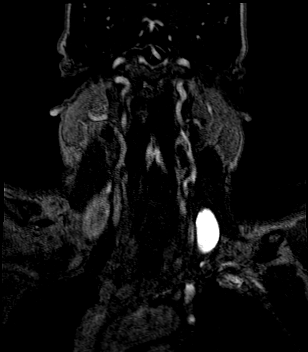
[im 62/104]
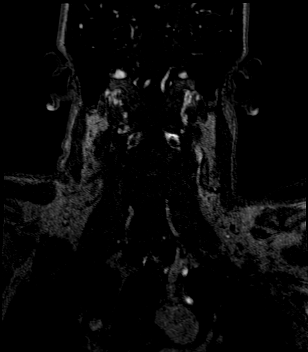
[im 83/104]
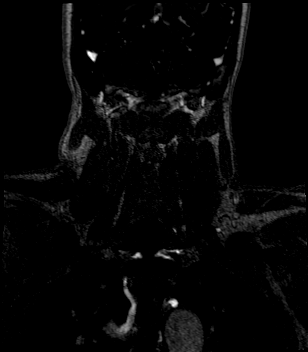
[im 104/104]
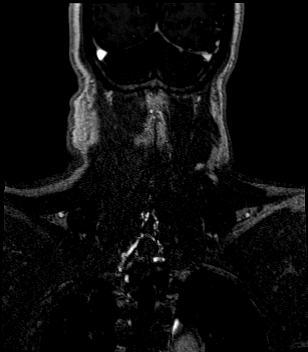

[Series 39: angio_fl3d_cor_post_ttc=3.0s_moco-adv_sub · coronal · 0.9mm · 0.85mm/px · 1 of 104 slices shown (2 of 2)]
[im 1/104]
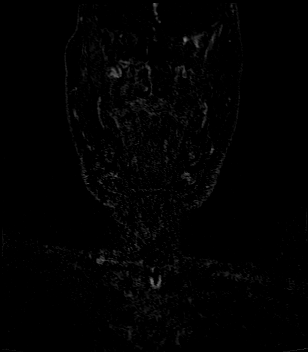

[43 of 48 positions shown; findings below may reference images not displayed]

FINDINGS: MR HEAD FINDINGS

Brain: No acute infarction, hemorrhage, hydrocephalus, extra-axial
collection or mass lesion. Mild chronic small vessel ischemic type
change in the hemispheric white matter.

Vascular: See below

Skull and upper cervical spine: Expansile lesion in the left
paramedian parietal bone with circumscribed margins by MRI, up to 2
cm. No significant change from [V6], most consistent with fibrous
dysplasia based on CT matrix.

Sinuses/Orbits: Negative.

MRA HEAD FINDINGS

Anterior circulation: Vessels are smoothly contoured and widely
patent. No evidence of vascular malformation or aneurysm.

Posterior circulation: Vertebral and basilar arteries are smooth and
widely patent. At the left V3 segment there is partial obscuration
due to intravenous contrast, but normal appearing by time-of-flight
technique. No branch occlusion, beading, or aneurysm.

Anatomic variants:None significant

MRA NECK FINDINGS

Aortic arch: Negative

Right carotid system: Vessels are smooth and diffusely patent

Left carotid system: Vessels are smooth and diffusely patent.

Vertebral arteries: No proximal subclavian or vertebral stenosis or
beading.

Other: Antegrade flow in carotid and vertebral arteries by
noncontrast technique.
IMPRESSION: Brain MRI:

No acute finding.

Intracranial and neck MRA:

Negative.

## 2020-08-26 MED ORDER — MECLIZINE HCL 25 MG PO TABS: 25.0000 mg | ORAL_TABLET | Freq: Three times a day (TID) | ORAL | 0 refills | Status: DC | PRN

## 2020-08-26 MED ORDER — DIPHENHYDRAMINE HCL 50 MG/ML IJ SOLN
25.0000 mg | Freq: Once | INTRAMUSCULAR | Status: AC
  Administered 2020-08-26: 25 mg via INTRAVENOUS
  Filled 2020-08-26: qty 1

## 2020-08-26 MED ORDER — GADOBUTROL 1 MMOL/ML IV SOLN
10.0000 mL | Freq: Once | INTRAVENOUS | Status: AC | PRN
  Administered 2020-08-26: 10 mL via INTRAVENOUS

## 2020-08-26 MED ORDER — LABETALOL HCL 5 MG/ML IV SOLN
20.0000 mg | Freq: Once | INTRAVENOUS | Status: AC
  Administered 2020-08-26: 20 mg via INTRAVENOUS

## 2020-08-26 MED ORDER — METOCLOPRAMIDE HCL 5 MG/ML IJ SOLN
10.0000 mg | Freq: Once | INTRAMUSCULAR | Status: AC
  Administered 2020-08-26: 10 mg via INTRAVENOUS
  Filled 2020-08-26: qty 2

## 2020-08-26 MED ORDER — LABETALOL HCL 5 MG/ML IV SOLN
INTRAVENOUS | Status: AC
  Filled 2020-08-26: qty 4

## 2020-08-26 MED ORDER — MIDAZOLAM HCL 2 MG/2ML IJ SOLN
1.0000 mg | Freq: Once | INTRAMUSCULAR | Status: AC
  Administered 2020-08-26: 1 mg via INTRAVENOUS
  Filled 2020-08-26: qty 2

## 2020-08-26 NOTE — ED Provider Notes (Signed)
[facility] EMERGENCY DEPARTMENT Provider Note   CSN: [id number] Arrival date & time: 08/25/20  2307     History Chief Complaint  Patient presents with   Dizziness    Michael Calhoun is a 60 y.o. male.  Patient presents to the emergency department for evaluation of generalized weakness, dizziness, nausea and vomiting.  Patient reports that he had been doing a lot of yard work throughout the day today.  He does report that he had been trying to drink lots of fluids to prevent dehydration.  Patient does report that he had a headache through most of the day.  He comes to the ER by ambulance.  EMS report that he has been hypertensive and very dizzy.  He has had vomiting.  She was given Zofran during transport.  At arrival to the ER he reports that his headache is gone but he is still nauseated.  Patient reluctant to open his eyes because it makes his dizziness worse.      Past Medical History:  Diagnosis Date   Allergy    RHINITIS   Breast mass in male 07/2010   necrosis or possibly an ill-defined lipoma   Dyslipidemia    Hypertension    Wears partial dentures    upper    Patient Active Problem List   Diagnosis Date Noted   Allergic rhinitis due to pollen 05/22/2014   Essential hypertension, benign 10/04/2011   Hyperlipidemia 10/04/2011    Past Surgical History:  Procedure Laterality Date   WISDOM TOOTH EXTRACTION         Family History  Problem Relation Age of Onset   Other Father        died of "natural causes"   Heart disease Neg Hx    Stroke Neg Hx    Diabetes Neg Hx    Cancer Neg Hx    Colon cancer Neg Hx    Colon polyps Neg Hx    Crohn's disease Neg Hx    Ulcerative colitis Neg Hx    Rectal cancer Neg Hx    Stomach cancer Neg Hx    Pancreatic cancer Neg Hx    Liver cancer Neg Hx     Social History   Tobacco Use   Smoking status: Never   Smokeless tobacco: Never  Substance Use Topics   Alcohol use: Yes    Alcohol/week: 2.0  standard drinks    Types: 2 Cans of beer per week    Comment: 6 drinks a month   Drug use: No    Home Medications Prior to Admission medications   Medication Sig Start Date End Date Taking? Authorizing Provider  meclizine (ANTIVERT) 25 MG tablet Take 1 tablet (25 mg total) by mouth 3 (three) times daily as needed for dizziness. 08/26/20  Yes Tanisa Lagace, Gwenyth Allegra, MD  aspirin 81 MG tablet Take 81 mg by mouth daily.    [provider]  ibuprofen (ADVIL) 800 MG tablet Take 1 tablet (800 mg total) by mouth 3 (three) times daily. 02/29/20   Wieters, Hallie C, PA-C  lisinopril-hydrochlorothiazide (PRINZIDE,ZESTORETIC) 20-25 MG per tablet Take 1 tablet by mouth daily. 05/22/14 07/18/17  Denita Lung, MD  methocarbamol (ROBAXIN) 500 MG tablet Take 1 tablet (500 mg total) 3 (three) times daily between meals as needed by mouth. 12/03/16   Tanna Furry, MD  Multiple Vitamin (MULTIVITAMIN) tablet Take 1 tablet by mouth daily.      [provider]  ondansetron (ZOFRAN-ODT) 8 MG disintegrating tablet  Take 1 tablet (8 mg total) by mouth every 8 (eight) hours as needed for nausea. 07/18/17   Robyn Haber, MD  tiZANidine (ZANAFLEX) 4 MG tablet Take 0.5-1 tablets (2-4 mg total) by mouth every 6 (six) hours as needed for muscle spasms. 02/29/20   Wieters, Hallie C, PA-C  traMADol (ULTRAM) 50 MG tablet Take 1 tablet (50 mg total) by mouth every 8 (eight) hours as needed. 11/15/14   Tysinger, Camelia Eng, PA-C  verapamil (VERELAN PM) 360 MG 24 hr capsule Take 1 capsule (360 mg total) by mouth at bedtime. 06/25/14   Denita Lung, MD    Allergies    Patient has no known allergies.  Review of Systems   Review of Systems  Gastrointestinal:  Positive for nausea and vomiting.  Neurological:  Positive for dizziness and headaches.  All other systems reviewed and are negative.  Physical Exam Updated Vital Signs BP (!) 152/107   Pulse 90   Temp 98.1 F (36.7 C) (Tympanic)   Resp 17   Ht '6\' 2"'$   (1.88 m)   Wt 102.1 kg   SpO2 95%   BMI 28.89 kg/m   Physical Exam Vitals and nursing note reviewed.  Constitutional:      General: He is not in acute distress.    Appearance: Normal appearance. He is well-developed.  HENT:     Head: Normocephalic and atraumatic.     Right Ear: Hearing normal.     Left Ear: Hearing normal.     Nose: Nose normal.  Eyes:     Conjunctiva/sclera: Conjunctivae normal.     Pupils: Pupils are equal, round, and reactive to light.  Cardiovascular:     Rate and Rhythm: Regular rhythm.     Heart sounds: S1 normal and S2 normal. No murmur heard.   No friction rub. No gallop.  Pulmonary:     Effort: Pulmonary effort is normal. No respiratory distress.     Breath sounds: Normal breath sounds.  Chest:     Chest wall: No tenderness.  Abdominal:     General: Bowel sounds are normal.     Palpations: Abdomen is soft.     Tenderness: There is no abdominal tenderness. There is no guarding or rebound. Negative signs include Murphy's sign and McBurney's sign.     Hernia: No hernia is present.  Musculoskeletal:        General: Normal range of motion.     Cervical back: Normal range of motion and neck supple.  Skin:    General: Skin is warm and dry.     Findings: No rash.  Neurological:     Mental Status: He is alert and oriented to person, place, and time.     GCS: GCS eye subscore is 4. GCS verbal subscore is 5. GCS motor subscore is 6.     Cranial Nerves: No cranial nerve deficit.     Sensory: No sensory deficit.     Coordination: Coordination normal.  Psychiatric:        Speech: Speech normal.        Behavior: Behavior normal.        Thought Content: Thought content normal.    ED Results / Procedures / Treatments   Labs (all labs ordered are listed, but only abnormal results are displayed) Labs Reviewed  CBC WITH DIFFERENTIAL/PLATELET - Abnormal; Notable for the following components:      Result Value   WBC 14.0 (*)    RBC 4.15 (*)  Hemoglobin  12.8 (*)    HCT 37.7 (*)    Neutro Abs 12.4 (*)    All other components within normal limits  COMPREHENSIVE METABOLIC PANEL - Abnormal; Notable for the following components:   Potassium 2.7 (*)    Glucose, Bld 178 (*)    Creatinine, Ser 1.33 (*)    Total Bilirubin 1.4 (*)    All other components within normal limits  LACTIC ACID, PLASMA - Abnormal; Notable for the following components:   Lactic Acid, Venous 2.1 (*)    All other components within normal limits  CK - Abnormal; Notable for the following components:   Total CK 528 (*)    All other components within normal limits  URINALYSIS, ROUTINE W REFLEX MICROSCOPIC - Abnormal; Notable for the following components:   Ketones, ur 15 (*)    All other components within normal limits  CBG MONITORING, ED - Abnormal; Notable for the following components:   Glucose-Capillary 167 (*)    All other components within normal limits  TROPONIN I (HIGH SENSITIVITY)  TROPONIN I (HIGH SENSITIVITY)    EKG EKG Interpretation  Date/Time:  Monday August 25 2020 23:10:20 EDT Ventricular Rate:  60 PR Interval:  169 QRS Duration: 94 QT Interval:  443 QTC Calculation: 443 R Axis:   32 Text Interpretation: Sinus rhythm Probable left atrial enlargement Abnormal T, consider ischemia, anterior leads No previous tracing Confirmed by Orpah Greek [phone]) on 08/25/2020 11:55:52 PM  Radiology CT HEAD WO CONTRAST (5MM)  Result Date: 08/26/2020 CLINICAL DATA:  dizziness all day after awaking with a HA this a.m., worsening after 21:30. unable to open eyes without vomiting (nystagmus witnessed), worked outside all day. EXAM: CT HEAD WITHOUT CONTRAST TECHNIQUE: Contiguous axial images were obtained from the base of the skull through the vertex without intravenous contrast. COMPARISON:  None. FINDINGS: Brain: No evidence of large-territorial acute infarction. No parenchymal hemorrhage. No mass lesion. No extra-axial collection. No mass effect or midline  shift. No hydrocephalus. Basilar cisterns are patent. Vascular: No hyperdense vessel. Skull: No acute fracture or focal lesion. Sinuses/Orbits: Paranasal sinuses and mastoid air cells are clear. The orbits are unremarkable. Other: None. IMPRESSION: No acute intracranial abnormality. Electronically Signed   By: Iven Finn M.D.   On: 08/26/2020 00:03   MR ANGIO HEAD WO CONTRAST  Result Date: 08/26/2020 CLINICAL DATA:  Dizziness after waking with headache this morning. Vertebral dissection suspected. EXAM: MRI HEAD WITHOUT CONTRAST MRA HEAD WITHOUT CONTRAST MRA OF THE NECK WITHOUT AND WITH CONTRAST TECHNIQUE: Multiplanar, multi-echo pulse sequences of the brain and surrounding structures were acquired without intravenous contrast. Angiographic images of the Circle of Willis were acquired using MRA technique without intravenous contrast. Angiographic images of the neck were acquired using MRA technique without and with intravenous contrast. Carotid stenosis measurements (when applicable) are obtained utilizing NASCET criteria, using the distal internal carotid diameter as the denominator. CONTRAST:  87m GADAVIST GADOBUTROL 1 MMOL/ML IV SOLN COMPARISON:  No pertinent prior exam. FINDINGS: MR HEAD FINDINGS Brain: No acute infarction, hemorrhage, hydrocephalus, extra-axial collection or mass lesion. Mild chronic small vessel ischemic type change in the hemispheric white matter. 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 Sinuses/Orbits: Negative. MRA HEAD FINDINGS Anterior circulation: Vessels are smoothly contoured and widely patent. No evidence of vascular malformation or aneurysm. Posterior  circulation: Vertebral and basilar arteries are smooth and widely patent. At the left V3 segment there is partial obscuration  due to intravenous contrast, but normal appearing by time-of-flight technique. No branch occlusion, beading, or aneurysm. Anatomic variants:None significant MRA NECK FINDINGS Aortic arch: Negative Right carotid system: Vessels are smooth and diffusely patent Left carotid system: Vessels are smooth and diffusely patent. Vertebral arteries: No proximal subclavian or vertebral stenosis or beading. Other: Antegrade flow in carotid and vertebral arteries by noncontrast technique. IMPRESSION: Brain MRI: No acute finding. Intracranial and neck MRA: Negative. Electronically Signed   By: Monte Fantasia M.D.   On: 08/26/2020 06:44   MR Angiogram Neck W or Wo Contrast  Result Date: 08/26/2020 CLINICAL DATA:  Dizziness after waking with headache this morning. Vertebral dissection suspected. EXAM: MRI HEAD WITHOUT CONTRAST MRA HEAD WITHOUT CONTRAST MRA OF THE NECK WITHOUT AND WITH CONTRAST TECHNIQUE: Multiplanar, multi-echo pulse sequences of the brain and surrounding structures were acquired without intravenous contrast. Angiographic images of the Circle of Willis were acquired using MRA technique without intravenous contrast. Angiographic images of the neck were acquired using MRA technique without and with intravenous contrast. Carotid stenosis measurements (when applicable) are obtained utilizing NASCET criteria, using the distal internal carotid diameter as the denominator. CONTRAST:  70m GADAVIST GADOBUTROL 1 MMOL/ML IV SOLN COMPARISON:  No pertinent prior exam. FINDINGS: MR HEAD FINDINGS Brain: No acute infarction, hemorrhage, hydrocephalus, extra-axial collection or mass lesion. Mild chronic small vessel ischemic type change in the hemispheric white matter. 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 Sinuses/Orbits:  Negative. MRA HEAD FINDINGS Anterior circulation: Vessels are smoothly contoured and widely patent. No evidence of vascular malformation or aneurysm. Posterior circulation: Vertebral and basilar arteries are smooth and widely patent. At the left V3 segment there is partial obscuration due to intravenous contrast, but normal appearing by time-of-flight technique. No branch occlusion, beading, or aneurysm. Anatomic variants:None significant MRA NECK FINDINGS Aortic arch: Negative Right carotid system: Vessels are smooth and diffusely patent Left carotid system: Vessels are smooth and diffusely patent. Vertebral arteries: No proximal subclavian or vertebral stenosis or beading. Other: Antegrade flow in carotid and vertebral arteries by noncontrast technique. IMPRESSION: Brain MRI: No acute finding. Intracranial and neck MRA: Negative. Electronically Signed   By: JMonte FantasiaM.D.   On: 08/26/2020 06:44   MR BRAIN WO CONTRAST  Result Date: 08/26/2020 CLINICAL DATA:  Dizziness after waking with headache this morning. Vertebral dissection suspected. EXAM: MRI HEAD WITHOUT CONTRAST MRA HEAD WITHOUT CONTRAST MRA OF THE NECK WITHOUT AND WITH CONTRAST TECHNIQUE: Multiplanar, multi-echo pulse sequences of the brain and surrounding structures were acquired without intravenous contrast. Angiographic images of the Circle of Willis were acquired using MRA technique without intravenous contrast. Angiographic images of the neck were acquired using MRA technique without and with intravenous contrast. Carotid stenosis measurements (when applicable) are obtained utilizing NASCET criteria, using the distal internal carotid diameter as the denominator. CONTRAST:  163mGADAVIST GADOBUTROL 1 MMOL/ML IV SOLN COMPARISON:  No pertinent prior exam. FINDINGS: MR HEAD FINDINGS Brain: No acute infarction, hemorrhage, hydrocephalus, extra-axial collection or mass lesion. Mild chronic small vessel ischemic type change in the hemispheric white  matter. Vascular: See below Skull and upper cervical spine: Expansile lesion in the left paramedian parietal bone with circumscribed margins by MRI, up to  2 cm. 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 Sinuses/Orbits: Negative. MRA HEAD FINDINGS Anterior circulation: Vessels are smoothly contoured and widely patent. No evidence of vascular malformation or aneurysm. Posterior circulation: Vertebral and basilar arteries are smooth and widely patent. At the left V3 segment there is partial obscuration due to intravenous contrast, but normal appearing by time-of-flight technique. No branch occlusion, beading, or aneurysm. Anatomic variants:None significant MRA NECK FINDINGS Aortic arch: Negative Right carotid system: Vessels are smooth and diffusely patent Left carotid system: Vessels are smooth and diffusely patent. Vertebral arteries: No proximal subclavian or vertebral stenosis or beading. Other: Antegrade flow in carotid and vertebral arteries by noncontrast technique. IMPRESSION: Brain MRI: No acute finding. Intracranial and neck MRA: Negative. Electronically Signed   By: Monte Fantasia M.D.   On: 08/26/2020 06:44    Procedures Procedures   Medications Ordered in ED Medications  labetalol (NORMODYNE) injection 20 mg (20 mg Intravenous Given 08/26/20 0059)  midazolam (VERSED) injection 1 mg (1 mg Intravenous Given 08/26/20 0151)  metoCLOPramide (REGLAN) injection 10 mg (10 mg Intravenous Given 08/26/20 0204)  diphenhydrAMINE (BENADRYL) injection 25 mg (25 mg Intravenous Given 08/26/20 0204)  gadobutrol (GADAVIST) 1 MMOL/ML injection 10 mL (10 mLs Intravenous Contrast Given 08/26/20 0557)    ED Course  I have reviewed the triage vital signs and the nursing notes.  Pertinent labs & imaging results that were available during my care of the patient were reviewed by me and considered in my medical decision making (see chart for details).    MDM  Rules/Calculators/A&P                           Patient presents to the emergency department for evaluation of dizziness.  Patient experiencing severe vertiginous type symptoms acutely.  Patient had been working outside in the heat over the course of the day.  He does report that he tried to keep up with his hydration while he was working.  He developed dizziness, headache, nausea, vomiting and presented to the ER.  Patient very hypertensive at arrival.  He has a nonfocal neurologic exam.  Blood pressure treated with labetalol.  Vertigo symptoms treated with Versed, Reglan, Benadryl.  Patient also hydrated.  Work-up has been reassuring.  No kidney failure.  No significant CPK elevation to suggest rhabdomyolysis.  MRI negative for stroke or vascular abnormality.  Patient will be appropriate for discharge.  Final Clinical Impression(s) / ED Diagnoses Final diagnoses:  Vertigo    Rx / DC Orders ED Discharge Orders          Ordered    meclizine (ANTIVERT) 25 MG tablet  3 times daily PRN        08/26/20 0654             Orpah Greek, MD 08/26/20 [phone]

## 2020-08-26 NOTE — ED Notes (Signed)
Pt in MRI.

## 2020-08-26 NOTE — Discharge Instructions (Addendum)
Make sure you take your blood pressure medication as prescribed.  Make sure you are drinking lots of fluids and staying cool for the next couple of days.  Please schedule a blood pressure check at your doctor's office in the next few days.  If your symptoms worsen, return to the ER.

## 2022-04-21 ENCOUNTER — Encounter: Payer: Self-pay | Admitting: Family Medicine

## 2022-04-21 ENCOUNTER — Ambulatory Visit (INDEPENDENT_AMBULATORY_CARE_PROVIDER_SITE_OTHER): Payer: BC Managed Care – PPO | Admitting: Family Medicine

## 2022-04-21 VITALS — BP 142/100 | HR 72 | Temp 98.3°F | Resp 16 | Ht 72.0 in | Wt 233.0 lb

## 2022-04-21 DIAGNOSIS — B351 Tinea unguium: Secondary | ICD-10-CM | POA: Diagnosis not present

## 2022-04-21 MED ORDER — TERBINAFINE HCL 250 MG PO TABS: 250.0000 mg | ORAL_TABLET | Freq: Every day | ORAL | 0 refills | Status: DC

## 2022-04-21 NOTE — Progress Notes (Signed)
   Subjective:    Patient ID: Michael Calhoun, male    DOB: 06-25-1960, 62 y.o.   MRN: [id number]  HPI He is here for consult concerning toenail fungus.  It is especially bothering him on his left great toe.   Review of Systems     Objective:   Physical Exam Exam of both feet does show thickening of some of the nails as well as hyperpigmentation.       Assessment & Plan:  Onychomycosis - Plan: terbinafine (LAMISIL) 250 MG tablet I discussed the treatment of onychomycosis and the fact that it takes quite a long time to show an improvement.  He has about a 70% chance of efficacy.  Explained it could take a full year before he gets complete resolution but he was still like proceed with this.  Also recommend he come back for complete exam at sometime in the future and also 3 months for recheck on the onychomycosis.

## 2022-07-22 ENCOUNTER — Ambulatory Visit (INDEPENDENT_AMBULATORY_CARE_PROVIDER_SITE_OTHER): Payer: BC Managed Care – PPO | Admitting: Family Medicine

## 2022-07-22 ENCOUNTER — Encounter: Payer: Self-pay | Admitting: Family Medicine

## 2022-07-22 DIAGNOSIS — I10 Essential (primary) hypertension: Secondary | ICD-10-CM

## 2022-07-22 DIAGNOSIS — B351 Tinea unguium: Secondary | ICD-10-CM | POA: Diagnosis not present

## 2022-07-22 MED ORDER — LISINOPRIL-HYDROCHLOROTHIAZIDE 20-25 MG PO TABS: 1.0000 | ORAL_TABLET | Freq: Every day | ORAL | 3 refills | Status: DC

## 2022-07-22 MED ORDER — TERBINAFINE HCL 250 MG PO TABS: 250.0000 mg | ORAL_TABLET | Freq: Every day | ORAL | 1 refills | Status: DC

## 2022-07-22 NOTE — Progress Notes (Signed)
   Subjective:    Patient ID: Michael Calhoun, male    DOB: [birth year], 62 y.o.   MRN: [id number]  HPI He is here for recheck on his Lamisil.  He does think that he see some improvement in his toes.  He also needs a refill on his blood pressure medication.   Review of Systems     Objective:    Physical Exam  Exam of the left foot does show minimal improvement in the nail bed.  Blood pressure is recorded.      Assessment & Plan:   Problem List Items Addressed This Visit     Essential hypertension, benign   Relevant Medications   lisinopril-hydrochlorothiazide (ZESTORETIC) 20-25 MG tablet   Other Visit Diagnoses     Onychomycosis       Relevant Medications   terbinafine (LAMISIL) 250 MG tablet

## 2023-02-02 ENCOUNTER — Ambulatory Visit: Payer: BC Managed Care – PPO | Admitting: Family Medicine

## 2023-02-02 ENCOUNTER — Other Ambulatory Visit: Payer: Self-pay | Admitting: Family Medicine

## 2023-02-02 ENCOUNTER — Encounter: Payer: Self-pay | Admitting: Family Medicine

## 2023-02-02 VITALS — BP 134/86 | HR 80 | Ht 72.75 in | Wt 234.0 lb

## 2023-02-02 DIAGNOSIS — Z0000 Encounter for general adult medical examination without abnormal findings: Secondary | ICD-10-CM | POA: Diagnosis not present

## 2023-02-02 DIAGNOSIS — I10 Essential (primary) hypertension: Secondary | ICD-10-CM | POA: Diagnosis not present

## 2023-02-02 DIAGNOSIS — E782 Mixed hyperlipidemia: Secondary | ICD-10-CM

## 2023-02-02 DIAGNOSIS — J301 Allergic rhinitis due to pollen: Secondary | ICD-10-CM | POA: Diagnosis not present

## 2023-02-02 DIAGNOSIS — Z630 Problems in relationship with spouse or partner: Secondary | ICD-10-CM

## 2023-02-02 DIAGNOSIS — Z1159 Encounter for screening for other viral diseases: Secondary | ICD-10-CM

## 2023-02-02 DIAGNOSIS — Z23 Encounter for immunization: Secondary | ICD-10-CM

## 2023-02-02 DIAGNOSIS — B351 Tinea unguium: Secondary | ICD-10-CM

## 2023-02-02 MED ORDER — TERBINAFINE HCL 250 MG PO TABS: 250.0000 mg | ORAL_TABLET | Freq: Every day | ORAL | 1 refills | Status: AC

## 2023-02-02 MED ORDER — VERAPAMIL HCL ER 360 MG PO CP24: 360.0000 mg | ORAL_CAPSULE | Freq: Every day | ORAL | 3 refills | Status: DC

## 2023-02-02 NOTE — Progress Notes (Signed)
 Complete physical exam  Patient: Michael Calhoun   DOB: 10-Aug-1960   63 y.o. Male  MRN: [id number]  Subjective:    Chief Complaint  Patient presents with   Annual Exam    Non fasting. Lunch: fried rice with beans    Michael Calhoun is a 63 y.o. male who presents today for a complete physical exam. He reports consuming a general diet. The patient does not participate in regular exercise at present. He generally feels well. He reports sleeping well. He he continues on his blood pressure medications and is having no difficulty with him.  He is also taking Lamisil  to help with onychomycosis.  He is under some stress and is in the process of getting a divorce.  But presently he is separated.  Apparently there has been some dishonesty coming from his wife.  Otherwise he has no particular concerns or complaints.  Most recent fall risk assessment:    02/02/2023    1:39 PM  Fall Risk   Falls in the past year? 0  Number falls in past yr: 0  Injury with Fall? 0  Follow up Falls evaluation completed     Most recent depression screenings:    02/02/2023    1:39 PM 04/21/2022    3:56 PM  PHQ 2/9 Scores  PHQ - 2 Score 0 0    Vision:Not within last year  and Dental: No current dental problems and No regular dental care     Patient Care Team: Joyce Norleen BROCKS, MD as PCP - General (Family Medicine)   Outpatient Medications Prior to Visit  Medication Sig   aspirin 81 MG tablet Take 81 mg by mouth daily.   lisinopril -hydrochlorothiazide  (ZESTORETIC ) 20-25 MG tablet Take 1 tablet by mouth daily.   Multiple Vitamin (MULTIVITAMIN) tablet Take 1 tablet by mouth daily.     [DISCONTINUED] terbinafine  (LAMISIL ) 250 MG tablet Take 1 tablet (250 mg total) by mouth daily.   [DISCONTINUED] verapamil  (VERELAN  PM) 360 MG 24 hr capsule Take 1 capsule (360 mg total) by mouth at bedtime.   ibuprofen  (ADVIL ) 800 MG tablet Take 1 tablet (800 mg total) by mouth 3 (three) times daily. (Patient not taking:  Reported on 02/02/2023)   No facility-administered medications prior to visit.    Review of Systems  All other systems reviewed and are negative.  Family and social history as well as health maintenance and immunizations was reviewed.       Objective:       Physical Exam  Alert and in no distress. Tympanic membranes and canals are normal. Pharyngeal area is normal. Neck is supple without adenopathy or thyromegaly. Cardiac exam shows a regular sinus rhythm without murmurs or gallops. Lungs are clear to auscultation.  Exam of the great toenail shows very little improvement.     Assessment & Plan:    Routine general medical examination at a health care facility - Plan: CBC with Differential/Platelet, Comprehensive metabolic panel, Lipid panel  Seasonal allergic rhinitis due to pollen  Essential hypertension, benign - Plan: CBC with Differential/Platelet, Comprehensive metabolic panel, verapamil  (VERELAN ) 360 MG 24 hr capsule  Mixed hyperlipidemia - Plan: Lipid panel  Need for Tdap vaccination - Plan: Tdap vaccine greater than or equal to 7yo IM  Need for shingles vaccine - Plan: Varicella-zoster vaccine IM  Onychomycosis - Plan: terbinafine  (LAMISIL ) 250 MG tablet  Need for hepatitis C screening test - Plan: Hepatitis C antibody  Marital stress  Immunization History  Administered Date(s) Administered  Hepatitis B 08/21/1997, 10/09/1997, 03/11/1998   Td 02/04/2005    Health Maintenance  Topic Date Due   HIV Screening  Never done   Hepatitis C Screening  Never done   Zoster Vaccines- Shingrix  (1 of 2) Never done   DTaP/Tdap/Td (2 - Tdap) 02/05/2015   INFLUENZA VACCINE  Never done   COVID-19 Vaccine (1 - 2024-25 season) Never done   Colonoscopy  08/20/2024   HPV VACCINES  Aged Out    Discussed health benefits of physical activity, and encouraged him to engage in regular exercise appropriate for his age and condition.  Problem List Items Addressed This Visit      Allergic rhinitis due to pollen   Essential hypertension, benign   Relevant Medications   verapamil  (VERELAN ) 360 MG 24 hr capsule   Other Relevant Orders   CBC with Differential/Platelet   Comprehensive metabolic panel   Hyperlipidemia   Relevant Medications   verapamil  (VERELAN ) 360 MG 24 hr capsule   Other Relevant Orders   Lipid panel   Other Visit Diagnoses       Routine general medical examination at a health care facility    -  Primary   Relevant Orders   CBC with Differential/Platelet   Comprehensive metabolic panel   Lipid panel     Need for Tdap vaccination       Relevant Orders   Tdap vaccine greater than or equal to 7yo IM     Need for shingles vaccine       Relevant Orders   Varicella-zoster vaccine IM     Onychomycosis       Relevant Medications   terbinafine  (LAMISIL ) 250 MG tablet     Need for hepatitis C screening test       Relevant Orders   Hepatitis C antibody     Marital stress          Return in about 3 months (around 05/03/2023).  For blood work concerning Lamisil  and also to check blood pressure     Norleen Jobs, MD

## 2023-02-03 ENCOUNTER — Other Ambulatory Visit: Payer: Self-pay | Admitting: Family Medicine

## 2023-02-03 ENCOUNTER — Other Ambulatory Visit: Payer: Self-pay

## 2023-02-03 DIAGNOSIS — I10 Essential (primary) hypertension: Secondary | ICD-10-CM

## 2023-02-03 LAB — LIPID PANEL
Chol/HDL Ratio: 4.7 {ratio} (ref 0.0–5.0)
Cholesterol, Total: 233 mg/dL — ABNORMAL HIGH (ref 100–199)
HDL: 50 mg/dL (ref >39)
LDL Chol Calc (NIH): 163 mg/dL — ABNORMAL HIGH (ref 0–99)
Triglycerides: 111 mg/dL (ref 0–149)
VLDL Cholesterol Cal: 20 mg/dL (ref 5–40)

## 2023-02-03 LAB — COMPREHENSIVE METABOLIC PANEL
ALT: 18 [IU]/L (ref 0–44)
AST: 20 [IU]/L (ref 0–40)
Albumin: 4.3 g/dL (ref 3.8–4.9)
Alkaline Phosphatase: 63 [IU]/L (ref 44–121)
BUN/Creatinine Ratio: 9 — ABNORMAL LOW (ref 10–24)
BUN: 10 mg/dL (ref 8–27)
Bilirubin Total: 0.6 mg/dL (ref 0.0–1.2)
CO2: 27 mmol/L (ref 20–29)
Calcium: 9.3 mg/dL (ref 8.6–10.2)
Chloride: 96 mmol/L (ref 96–106)
Creatinine, Ser: 1.14 mg/dL (ref 0.76–1.27)
Globulin, Total: 2.6 g/dL (ref 1.5–4.5)
Glucose: 108 mg/dL — ABNORMAL HIGH (ref 70–99)
Potassium: 3 mmol/L — ABNORMAL LOW (ref 3.5–5.2)
Sodium: 140 mmol/L (ref 134–144)
Total Protein: 6.9 g/dL (ref 6.0–8.5)
eGFR: 74 mL/min/{1.73_m2} (ref >59)

## 2023-02-03 LAB — CBC WITH DIFFERENTIAL/PLATELET
Basophils Absolute: 0 10*3/uL (ref 0.0–0.2)
Basos: 1 %
EOS (ABSOLUTE): 0.2 10*3/uL (ref 0.0–0.4)
Eos: 3 %
Hematocrit: 39.7 % (ref 37.5–51.0)
Hemoglobin: 13.7 g/dL (ref 13.0–17.7)
Immature Grans (Abs): 0 10*3/uL (ref 0.0–0.1)
Immature Granulocytes: 0 %
Lymphocytes Absolute: 2.1 10*3/uL (ref 0.7–3.1)
Lymphs: 27 %
MCH: 31.6 pg (ref 26.6–33.0)
MCHC: 34.5 g/dL (ref 31.5–35.7)
MCV: 92 fL (ref 79–97)
Monocytes Absolute: 0.5 10*3/uL (ref 0.1–0.9)
Monocytes: 7 %
Neutrophils Absolute: 4.8 10*3/uL (ref 1.4–7.0)
Neutrophils: 62 %
Platelets: 241 10*3/uL (ref 150–450)
RBC: 4.33 x10E6/uL (ref 4.14–5.80)
RDW: 12.4 % (ref 11.6–15.4)
WBC: 7.7 10*3/uL (ref 3.4–10.8)

## 2023-02-03 LAB — HEPATITIS C ANTIBODY: Hep C Virus Ab: NONREACTIVE

## 2023-02-03 MED ORDER — AMLODIPINE BESYLATE 5 MG PO TABS: 5.0000 mg | ORAL_TABLET | Freq: Every day | ORAL | 3 refills | Status: DC

## 2023-02-03 MED ORDER — VERAPAMIL HCL ER 360 MG PO CP24: 360.0000 mg | ORAL_CAPSULE | Freq: Every day | ORAL | 3 refills | Status: DC

## 2023-02-03 NOTE — Telephone Encounter (Signed)
 Sent in norvasc 5 mg per [employer]

## 2023-02-07 LAB — HGB A1C W/O EAG: Hgb A1c MFr Bld: 4.8 % (ref 4.8–5.6)

## 2023-02-07 LAB — SPECIMEN STATUS REPORT

## 2023-05-04 ENCOUNTER — Ambulatory Visit (INDEPENDENT_AMBULATORY_CARE_PROVIDER_SITE_OTHER): Payer: BC Managed Care – PPO | Admitting: Family Medicine

## 2023-05-04 ENCOUNTER — Encounter: Payer: Self-pay | Admitting: Family Medicine

## 2023-05-04 VITALS — BP 134/82 | HR 85 | Wt 229.4 lb

## 2023-05-04 DIAGNOSIS — B351 Tinea unguium: Secondary | ICD-10-CM | POA: Diagnosis not present

## 2023-05-04 DIAGNOSIS — Z79899 Other long term (current) drug therapy: Secondary | ICD-10-CM | POA: Diagnosis not present

## 2023-05-04 NOTE — Progress Notes (Signed)
   Subjective:    Patient ID: Michael Calhoun, male    DOB: 01-23-1961, 63 y.o.   MRN: [id number]  HPI He is here for follow-up on his onychomycosis and follow-up on his blood pressure.   Review of Systems     Objective:    Physical Exam Alert and in no distress.  Blood pressure is recorded.  Exam of his feet does show the nails to almost be back to normal. Blood pressure is recorded.      Assessment & Plan:  Onychomycosis  Encounter for long-term (current) use of medications - Plan: Comprehensive metabolic panel with GFR

## 2023-05-05 LAB — COMPREHENSIVE METABOLIC PANEL WITH GFR
ALT: 19 IU/L (ref 0–44)
AST: 23 IU/L (ref 0–40)
Albumin: 4.5 g/dL (ref 3.8–4.9)
Alkaline Phosphatase: 68 IU/L (ref 44–121)
BUN/Creatinine Ratio: 7 — ABNORMAL LOW (ref 10–24)
BUN: 8 mg/dL (ref 8–27)
Bilirubin Total: 0.8 mg/dL (ref 0.0–1.2)
CO2: 27 mmol/L (ref 20–29)
Calcium: 9.2 mg/dL (ref 8.6–10.2)
Chloride: 95 mmol/L — ABNORMAL LOW (ref 96–106)
Creatinine, Ser: 1.18 mg/dL (ref 0.76–1.27)
Globulin, Total: 2.5 g/dL (ref 1.5–4.5)
Glucose: 77 mg/dL (ref 70–99)
Potassium: 3 mmol/L — ABNORMAL LOW (ref 3.5–5.2)
Sodium: 139 mmol/L (ref 134–144)
Total Protein: 7 g/dL (ref 6.0–8.5)
eGFR: 71 mL/min/{1.73_m2} (ref >59)

## 2023-05-17 ENCOUNTER — Other Ambulatory Visit: Payer: Self-pay | Admitting: Family Medicine

## 2023-05-17 DIAGNOSIS — I10 Essential (primary) hypertension: Secondary | ICD-10-CM

## 2023-05-17 NOTE — Telephone Encounter (Signed)
 Copied from CRM [id number]. Topic: Clinical - Medication Refill >> May 17, 2023  4:05 PM Orien Bird wrote: Most Recent Primary Care Visit:  Provider: Watson Hacking  Department: [facility]  Visit Type: FOLLOW UP 30  Date: 05/04/2023  Medication: lisinopril -hydrochlorothiazide  (ZESTORETIC ) 20-25 MG tablet  Has the patient contacted their pharmacy? No (Agent: If no, request that the patient contact the pharmacy for the refill. If patient does not wish to contact the pharmacy document the reason why and proceed with request.) (Agent: If yes, when and what did the pharmacy advise?)  Is this the correct pharmacy for this prescription? Yes If no, delete pharmacy and type the correct one.  This is the patient's preferred pharmacy:  [facility] [address], [place] - [address] [place] [address] Phone: [phone] Fax: [phone]   Has the prescription been filled recently? No  Is the patient out of the medication? Yes  Has the patient been seen for an appointment in the last year OR does the patient have an upcoming appointment? Yes  Can we respond through MyChart? Yes  Agent: Please be advised that Rx refills may take up to 3 business days. We ask that you follow-up with your pharmacy.

## 2023-05-18 ENCOUNTER — Other Ambulatory Visit: Payer: Self-pay | Admitting: Family Medicine

## 2023-05-18 DIAGNOSIS — I10 Essential (primary) hypertension: Secondary | ICD-10-CM

## 2023-05-18 MED ORDER — LISINOPRIL-HYDROCHLOROTHIAZIDE 20-25 MG PO TABS: 1.0000 | ORAL_TABLET | Freq: Every day | ORAL | 1 refills | Status: DC

## 2023-05-20 NOTE — Telephone Encounter (Signed)
 Copied from CRM [id number]. Topic: Clinical - Medication Question >> May 19, 2023 12:26 PM Yolanda T wrote: Reason for CRM: patient called to find out why his request for verapamil  (VERELAN ) 360 MG 24 hr capsule was not sent to the pharmacy. Advised patient he has a script for lisinopril -hydrochlorothiazide  (ZESTORETIC ) 20-25 MG tablet that was called in on yesterday. Please f/u up with patient.

## 2023-06-08 ENCOUNTER — Telehealth: Payer: Self-pay

## 2023-06-08 NOTE — Telephone Encounter (Signed)
 Copied from CRM [id number]. Topic: Clinical - Medication Question >> Jun 08, 2023  8:42 AM Antwanette L wrote: Reason for CRM: The patient wants to know if Dr. Lalone can prescribe him verapmil. The patient is having cramps in his hands. Patient can be contacted by phone at [phone]   Preferred Pharmacy [facility] [address] [place] [address] Phone: [phone] Fax: [phone]

## 2023-06-16 ENCOUNTER — Ambulatory Visit (INDEPENDENT_AMBULATORY_CARE_PROVIDER_SITE_OTHER): Admitting: Family Medicine

## 2023-06-16 ENCOUNTER — Other Ambulatory Visit: Payer: Self-pay | Admitting: Family Medicine

## 2023-06-16 ENCOUNTER — Encounter: Payer: Self-pay | Admitting: Family Medicine

## 2023-06-16 VITALS — BP 148/90 | HR 74 | Wt 233.2 lb

## 2023-06-16 DIAGNOSIS — B351 Tinea unguium: Secondary | ICD-10-CM | POA: Diagnosis not present

## 2023-06-16 DIAGNOSIS — I10 Essential (primary) hypertension: Secondary | ICD-10-CM

## 2023-06-16 MED ORDER — VERAPAMIL HCL ER 360 MG PO CP24: 360.0000 mg | ORAL_CAPSULE | Freq: Every day | ORAL | 3 refills | Status: DC

## 2023-06-16 MED ORDER — LISINOPRIL-HYDROCHLOROTHIAZIDE 20-25 MG PO TABS: 1.0000 | ORAL_TABLET | Freq: Every day | ORAL | 1 refills | Status: AC

## 2023-06-16 NOTE — Progress Notes (Signed)
   Subjective:    Patient ID: Michael Calhoun, male    DOB: Apr 17, 1960, 63 y.o.   MRN: [id number]  HPI He is here for a recheck.  There are questions about which medicine to take for his blood pressure.  He normally takes lisinopril /HCTZ but for some reason amlodipine  was given which she has not taken.  He has been taking verapamil  would like to stay on that. He also continues on Lamisil  to help with his onychomycosis and thinks it is working.  Review of Systems     Objective:    Physical Exam Alert and in no distress blood pressure is slightly elevated however he has not been all of his medicines regularly.  Exam of the nails does show some slight clearing.       Assessment & Plan:  Essential hypertension, benign - Plan: lisinopril -hydrochlorothiazide  (ZESTORETIC ) 20-25 MG tablet, verapamil  (VERELAN ) 360 MG 24 hr capsule  Onychomycosis Amlodipine  was stopped.  He will be placed back on verapamil .  He is to continue to use the Lamisil  until it runs out.

## 2024-03-07 ENCOUNTER — Ambulatory Visit: Payer: BC Managed Care – PPO | Admitting: Family Medicine
